# Patient Record
Sex: Male | Born: 1970 | Race: White | Hispanic: No | Marital: Single | Smoking: Former smoker
Health system: Southern US, Community
[De-identification: ages and names within clinical notes are randomized; demographics above are authoritative.]

## PROBLEM LIST (undated history)

## (undated) DIAGNOSIS — R002 Palpitations: Secondary | ICD-10-CM

## (undated) DIAGNOSIS — I10 Essential (primary) hypertension: Secondary | ICD-10-CM

## (undated) DIAGNOSIS — M1990 Unspecified osteoarthritis, unspecified site: Secondary | ICD-10-CM

## (undated) DIAGNOSIS — Z96649 Presence of unspecified artificial hip joint: Secondary | ICD-10-CM

## (undated) DIAGNOSIS — Z8619 Personal history of other infectious and parasitic diseases: Secondary | ICD-10-CM

## (undated) DIAGNOSIS — F1910 Other psychoactive substance abuse, uncomplicated: Secondary | ICD-10-CM

## (undated) DIAGNOSIS — T84050A Periprosthetic osteolysis of internal prosthetic right hip joint, initial encounter: Secondary | ICD-10-CM

## (undated) DIAGNOSIS — T8484XD Pain due to internal orthopedic prosthetic devices, implants and grafts, subsequent encounter: Secondary | ICD-10-CM

## (undated) HISTORY — DX: Personal history of other infectious and parasitic diseases: Z86.19

## (undated) HISTORY — PX: JOINT REPLACEMENT: SHX530

## (undated) HISTORY — DX: Other psychoactive substance abuse, uncomplicated: F19.10

---

## 2005-12-10 ENCOUNTER — Ambulatory Visit: Payer: Self-pay | Admitting: Family Medicine

## 2007-08-05 DIAGNOSIS — K219 Gastro-esophageal reflux disease without esophagitis: Secondary | ICD-10-CM

## 2007-08-05 DIAGNOSIS — F329 Major depressive disorder, single episode, unspecified: Secondary | ICD-10-CM

## 2010-11-12 ENCOUNTER — Emergency Department (HOSPITAL_COMMUNITY)
Admission: EM | Admit: 2010-11-12 | Discharge: 2010-11-12 | Payer: Self-pay | Source: Home / Self Care | Admitting: Emergency Medicine

## 2014-02-21 ENCOUNTER — Emergency Department: Payer: Self-pay | Admitting: Internal Medicine

## 2015-05-07 ENCOUNTER — Encounter
Admission: RE | Admit: 2015-05-07 | Discharge: 2015-05-07 | Disposition: A | Discharge status: Home / Self Care | Payer: Self-pay | Source: Ambulatory Visit | Attending: Orthopedic Surgery | Admitting: Orthopedic Surgery

## 2015-05-07 DIAGNOSIS — Z01812 Encounter for preprocedural laboratory examination: Secondary | ICD-10-CM | POA: Insufficient documentation

## 2015-05-07 HISTORY — DX: Essential (primary) hypertension: I10

## 2015-05-07 HISTORY — DX: Unspecified osteoarthritis, unspecified site: M19.90

## 2015-05-07 LAB — BASIC METABOLIC PANEL
Anion gap: 8 (ref 5–15)
BUN: 15 mg/dL (ref 6–20)
CO2: 30 mmol/L (ref 22–32)
CREATININE: 1.2 mg/dL (ref 0.61–1.24)
Calcium: 9.2 mg/dL (ref 8.9–10.3)
Chloride: 102 mmol/L (ref 101–111)
GFR calc Af Amer: >60 mL/min (ref >60)
GLUCOSE: 147 mg/dL — AB (ref 65–99)
Potassium: 3.7 mmol/L (ref 3.5–5.1)
SODIUM: 140 mmol/L (ref 135–145)

## 2015-05-07 LAB — URINALYSIS COMPLETE WITH MICROSCOPIC (ARMC ONLY)
BACTERIA UA: NONE SEEN
Bilirubin Urine: NEGATIVE
GLUCOSE, UA: NEGATIVE mg/dL
HGB URINE DIPSTICK: NEGATIVE
Ketones, ur: NEGATIVE mg/dL
LEUKOCYTES UA: NEGATIVE
NITRITE: NEGATIVE
Protein, ur: 30 mg/dL — AB
Specific Gravity, Urine: 1.015 (ref 1.005–1.030)
Squamous Epithelial / LPF: NONE SEEN
WBC UA: NONE SEEN WBC/hpf (ref 0–5)
pH: 6 (ref 5.0–8.0)

## 2015-05-07 LAB — CBC
HCT: 47.6 % (ref 40.0–52.0)
Hemoglobin: 16.6 g/dL (ref 13.0–18.0)
MCH: 31.9 pg (ref 26.0–34.0)
MCHC: 34.8 g/dL (ref 32.0–36.0)
MCV: 91.6 fL (ref 80.0–100.0)
PLATELETS: 145 10*3/uL — AB (ref 150–440)
RBC: 5.2 MIL/uL (ref 4.40–5.90)
RDW: 12.9 % (ref 11.5–14.5)
WBC: 5.9 10*3/uL (ref 3.8–10.6)

## 2015-05-07 LAB — APTT: APTT: 28 s (ref 24–36)

## 2015-05-07 LAB — SURGICAL PCR SCREEN
MRSA, PCR: NEGATIVE
STAPHYLOCOCCUS AUREUS: POSITIVE — AB

## 2015-05-07 LAB — SEDIMENTATION RATE: SED RATE: 1 mm/h (ref 0–15)

## 2015-05-07 LAB — PROTIME-INR
INR: 0.99
PROTHROMBIN TIME: 13.3 s (ref 11.4–15.0)

## 2015-05-07 NOTE — Patient Instructions (Signed)
  Your procedure is scheduled on: 05/14/15 Report to Day Surgery. To find out your arrival time please call [phone] between 1PM - 3PM on  05/13/15.  Remember: Instructions that are not followed completely may result in serious medical risk, up to and including death, or upon the discretion of your surgeon and anesthesiologist your surgery may need to be rescheduled.    __X__ 1. Do not eat food or drink liquids after midnight. No gum chewing or hard candies.     __X__ 2. No Alcohol for 24 hours before or after surgery.   ____ 3. Bring all medications with you on the day of surgery if instructed.    ___X_ 4. Notify your doctor if there is any change in your medical condition     (cold, fever, infections).     Do not wear jewelry, make-up, hairpins, clips or nail polish.  Do not wear lotions, powders, or perfumes. You may wear deodorant.  Do not shave 48 hours prior to surgery. Men may shave face and neck.  Do not bring valuables to the hospital.    [facility] is not [facility] any belongings or valuables.               Contacts, dentures or bridgework may not be worn into surgery.  Leave your suitcase in the car. After surgery it may be brought to your room.  For patients admitted to the hospital, discharge time is determined by your                treatment team.   Patients discharged the day of surgery will not be allowed to drive home.   Please read over the following fact sheets that you were given:   Surgical Site Infection Prevention   ____ Take these medicines the morning of surgery with A SIP OF WATER:    1  2.   3.   4.  5.  6.  ____ Fleet Enema (as directed)   _X__ Use CHG Soap as directed  ____ Use inhalers on the day of surgery  ____ Stop metformin 2 days prior to surgery    ____ Take 1/2 of usual insulin dose the night before surgery and none on the morning of surgery.   ____ Stop Coumadin/Plavix/aspirin on   __X__ Stop Anti-inflammatories on   STOP 05/07/15   ____ Stop supplements until after surgery.    ____ Bring C-Pap to the hospital.

## 2015-05-08 LAB — ABO/RH: ABO/RH(D): B NEG

## 2015-05-14 ENCOUNTER — Inpatient Hospital Stay
Admission: RE | Admit: 2015-05-14 | Discharge: 2015-05-16 | DRG: 470 | Disposition: A | Discharge status: Home Health | Payer: Self-pay | Source: Ambulatory Visit | Attending: Orthopedic Surgery | Admitting: Orthopedic Surgery

## 2015-05-14 ENCOUNTER — Inpatient Hospital Stay: Payer: Self-pay | Admitting: Anesthesiology

## 2015-05-14 ENCOUNTER — Encounter: Payer: Self-pay | Admitting: Anesthesiology

## 2015-05-14 ENCOUNTER — Inpatient Hospital Stay: Payer: Self-pay

## 2015-05-14 ENCOUNTER — Encounter
Admission: RE | Disposition: A | Discharge status: Home Health | Payer: Self-pay | Source: Ambulatory Visit | Attending: Orthopedic Surgery

## 2015-05-14 DIAGNOSIS — M1612 Unilateral primary osteoarthritis, left hip: Principal | ICD-10-CM | POA: Diagnosis present

## 2015-05-14 DIAGNOSIS — Z79891 Long term (current) use of opiate analgesic: Secondary | ICD-10-CM

## 2015-05-14 DIAGNOSIS — Z419 Encounter for procedure for purposes other than remedying health state, unspecified: Secondary | ICD-10-CM

## 2015-05-14 DIAGNOSIS — Z79899 Other long term (current) drug therapy: Secondary | ICD-10-CM

## 2015-05-14 DIAGNOSIS — I10 Essential (primary) hypertension: Secondary | ICD-10-CM | POA: Diagnosis present

## 2015-05-14 DIAGNOSIS — Z791 Long term (current) use of non-steroidal anti-inflammatories (NSAID): Secondary | ICD-10-CM

## 2015-05-14 DIAGNOSIS — G8918 Other acute postprocedural pain: Secondary | ICD-10-CM

## 2015-05-14 DIAGNOSIS — K219 Gastro-esophageal reflux disease without esophagitis: Secondary | ICD-10-CM | POA: Diagnosis present

## 2015-05-14 HISTORY — PX: TOTAL HIP ARTHROPLASTY: SHX124

## 2015-05-14 LAB — CBC
HEMATOCRIT: 44.2 % (ref 40.0–52.0)
Hemoglobin: 15.4 g/dL (ref 13.0–18.0)
MCH: 31.9 pg (ref 26.0–34.0)
MCHC: 34.9 g/dL (ref 32.0–36.0)
MCV: 91.5 fL (ref 80.0–100.0)
PLATELETS: 139 10*3/uL — AB (ref 150–440)
RBC: 4.83 MIL/uL (ref 4.40–5.90)
RDW: 12.8 % (ref 11.5–14.5)
WBC: 6.1 10*3/uL (ref 3.8–10.6)

## 2015-05-14 LAB — CREATININE, SERUM
Creatinine, Ser: 1.53 mg/dL — ABNORMAL HIGH (ref 0.61–1.24)
GFR calc Af Amer: >60 mL/min (ref >60)
GFR calc non Af Amer: 53 mL/min — ABNORMAL LOW (ref >60)

## 2015-05-14 SURGERY — ARTHROPLASTY, HIP, TOTAL, ANTERIOR APPROACH
Anesthesia: Spinal | Laterality: Left | Wound class: Clean

## 2015-05-14 MED ORDER — ACETAMINOPHEN 325 MG PO TABS
650.0000 mg | ORAL_TABLET | Freq: Four times a day (QID) | ORAL | Status: DC | PRN
  Administered 2015-05-15: 650 mg via ORAL
  Filled 2015-05-14: qty 2

## 2015-05-14 MED ORDER — DOCUSATE SODIUM 100 MG PO CAPS
100.0000 mg | ORAL_CAPSULE | Freq: Two times a day (BID) | ORAL | Status: DC
  Administered 2015-05-14 – 2015-05-16 (×4): 100 mg via ORAL
  Filled 2015-05-14 (×4): qty 1

## 2015-05-14 MED ORDER — PHENYLEPHRINE HCL 10 MG/ML IJ SOLN
INTRAMUSCULAR | Status: DC | PRN
  Administered 2015-05-14: 100 ug via INTRAVENOUS
  Administered 2015-05-14 (×2): 150 ug via INTRAVENOUS

## 2015-05-14 MED ORDER — BUPIVACAINE-EPINEPHRINE 0.25% -1:200000 IJ SOLN
INTRAMUSCULAR | Status: DC | PRN
  Administered 2015-05-14: 30 mL

## 2015-05-14 MED ORDER — ONDANSETRON HCL 4 MG/2ML IJ SOLN: 4.0000 mg | Freq: Four times a day (QID) | INTRAMUSCULAR | Status: DC | PRN

## 2015-05-14 MED ORDER — MAGNESIUM CITRATE PO SOLN: 1.0000 | Freq: Once | ORAL | Status: AC | PRN

## 2015-05-14 MED ORDER — ADULT MULTIVITAMIN W/MINERALS CH
1.0000 | ORAL_TABLET | Freq: Every day | ORAL | Status: DC
  Administered 2015-05-14 – 2015-05-15 (×2): 1 via ORAL
  Filled 2015-05-14 (×4): qty 1

## 2015-05-14 MED ORDER — METOCLOPRAMIDE HCL 5 MG/ML IJ SOLN: 5.0000 mg | Freq: Three times a day (TID) | INTRAMUSCULAR | Status: DC | PRN

## 2015-05-14 MED ORDER — VANCOMYCIN HCL 1000 MG IV SOLR
1000.0000 mg | Freq: Once | INTRAVENOUS | Status: DC
  Administered 2015-05-14: 1000 mg via INTRAVENOUS

## 2015-05-14 MED ORDER — FAMOTIDINE 20 MG PO TABS
20.0000 mg | ORAL_TABLET | Freq: Once | ORAL | Status: AC
  Administered 2015-05-14: 20 mg via ORAL

## 2015-05-14 MED ORDER — LIDOCAINE HCL (CARDIAC) 20 MG/ML IV SOLN
INTRAVENOUS | Status: DC | PRN
  Administered 2015-05-14: 30 mg via INTRAVENOUS

## 2015-05-14 MED ORDER — NICOTINE 21 MG/24HR TD PT24
21.0000 mg | MEDICATED_PATCH | Freq: Every day | TRANSDERMAL | Status: DC
  Administered 2015-05-14 – 2015-05-16 (×3): 21 mg via TRANSDERMAL
  Filled 2015-05-14 (×3): qty 1

## 2015-05-14 MED ORDER — LORAZEPAM 2 MG/ML IJ SOLN: 1.0000 mg | Freq: Four times a day (QID) | INTRAMUSCULAR | Status: DC | PRN

## 2015-05-14 MED ORDER — DIPHENHYDRAMINE HCL 50 MG/ML IJ SOLN
INTRAMUSCULAR | Status: DC | PRN
  Administered 2015-05-14: 25 mg via INTRAVENOUS

## 2015-05-14 MED ORDER — THIAMINE HCL 100 MG/ML IJ SOLN
100.0000 mg | Freq: Every day | INTRAMUSCULAR | Status: DC
  Filled 2015-05-14: qty 1
  Filled 2015-05-14: qty 2
  Filled 2015-05-14 (×2): qty 1

## 2015-05-14 MED ORDER — TRANEXAMIC ACID 1000 MG/10ML IV SOLN
1000.0000 mg | INTRAVENOUS | Status: AC
  Filled 2015-05-14 (×2): qty 10

## 2015-05-14 MED ORDER — MIDAZOLAM HCL 5 MG/5ML IJ SOLN
INTRAMUSCULAR | Status: DC | PRN
  Administered 2015-05-14: 1 mg via INTRAVENOUS

## 2015-05-14 MED ORDER — METOCLOPRAMIDE HCL 10 MG PO TABS: 5.0000 mg | ORAL_TABLET | Freq: Three times a day (TID) | ORAL | Status: DC | PRN

## 2015-05-14 MED ORDER — BISACODYL 10 MG RE SUPP: 10.0000 mg | Freq: Every day | RECTAL | Status: DC | PRN

## 2015-05-14 MED ORDER — LORAZEPAM 2 MG PO TABS: 0.0000 mg | ORAL_TABLET | Freq: Two times a day (BID) | ORAL | Status: DC

## 2015-05-14 MED ORDER — NEOMYCIN-POLYMYXIN B GU 40-200000 IR SOLN
Status: AC
  Filled 2015-05-14: qty 20

## 2015-05-14 MED ORDER — FOLIC ACID 1 MG PO TABS
1.0000 mg | ORAL_TABLET | Freq: Every day | ORAL | Status: DC
  Administered 2015-05-14 – 2015-05-16 (×3): 1 mg via ORAL
  Filled 2015-05-14 (×6): qty 1

## 2015-05-14 MED ORDER — CLINDAMYCIN PHOSPHATE 900 MG/50ML IV SOLN
INTRAVENOUS | Status: AC
  Administered 2015-05-14: 900 mg via INTRAVENOUS
  Filled 2015-05-14: qty 50

## 2015-05-14 MED ORDER — VANCOMYCIN HCL IN DEXTROSE 1-5 GM/200ML-% IV SOLN
1000.0000 mg | Freq: Once | INTRAVENOUS | Status: AC
  Administered 2015-05-14: 1000 mg via INTRAVENOUS
  Filled 2015-05-14: qty 200

## 2015-05-14 MED ORDER — ONDANSETRON HCL 4 MG PO TABS: 4.0000 mg | ORAL_TABLET | Freq: Four times a day (QID) | ORAL | Status: DC | PRN

## 2015-05-14 MED ORDER — LORAZEPAM 2 MG PO TABS
0.0000 mg | ORAL_TABLET | Freq: Four times a day (QID) | ORAL | Status: DC
  Administered 2015-05-14: 4 mg via ORAL
  Administered 2015-05-14 – 2015-05-15 (×2): 2 mg via ORAL
  Administered 2015-05-15: 4 mg via ORAL
  Administered 2015-05-15 (×2): 2 mg via ORAL
  Filled 2015-05-14 (×4): qty 2
  Filled 2015-05-14 (×2): qty 1
  Filled 2015-05-14: qty 2

## 2015-05-14 MED ORDER — CLINDAMYCIN PHOSPHATE 900 MG/50ML IV SOLN: 900.0000 mg | Freq: Once | INTRAVENOUS | Status: DC

## 2015-05-14 MED ORDER — MAGNESIUM HYDROXIDE 400 MG/5ML PO SUSP
30.0000 mL | Freq: Every day | ORAL | Status: DC | PRN
  Administered 2015-05-15 – 2015-05-16 (×2): 30 mL via ORAL
  Filled 2015-05-14 (×2): qty 30

## 2015-05-14 MED ORDER — VITAMIN B-1 100 MG PO TABS
100.0000 mg | ORAL_TABLET | Freq: Every day | ORAL | Status: DC
  Administered 2015-05-14 – 2015-05-15 (×2): 100 mg via ORAL
  Filled 2015-05-14 (×6): qty 1

## 2015-05-14 MED ORDER — FENTANYL CITRATE (PF) 100 MCG/2ML IJ SOLN: 25.0000 ug | INTRAMUSCULAR | Status: DC | PRN

## 2015-05-14 MED ORDER — NEOMYCIN-POLYMYXIN B GU 40-200000 IR SOLN
Status: DC | PRN
  Administered 2015-05-14: 4 mL

## 2015-05-14 MED ORDER — HYDROCODONE-ACETAMINOPHEN 5-325 MG PO TABS
1.0000 | ORAL_TABLET | Freq: Four times a day (QID) | ORAL | Status: DC | PRN
  Administered 2015-05-14: 1 via ORAL
  Administered 2015-05-15 – 2015-05-16 (×5): 2 via ORAL
  Filled 2015-05-14: qty 2
  Filled 2015-05-14: qty 1
  Filled 2015-05-14 (×4): qty 2

## 2015-05-14 MED ORDER — ZOLPIDEM TARTRATE 5 MG PO TABS
5.0000 mg | ORAL_TABLET | Freq: Every evening | ORAL | Status: DC | PRN
  Administered 2015-05-14: 5 mg via ORAL
  Filled 2015-05-14: qty 1

## 2015-05-14 MED ORDER — ONDANSETRON HCL 4 MG/2ML IJ SOLN: 4.0000 mg | Freq: Once | INTRAMUSCULAR | Status: DC | PRN

## 2015-05-14 MED ORDER — PROPOFOL 10 MG/ML IV BOLUS
INTRAVENOUS | Status: DC | PRN
  Administered 2015-05-14: 25 mg via INTRAVENOUS

## 2015-05-14 MED ORDER — ACETAMINOPHEN 650 MG RE SUPP: 650.0000 mg | Freq: Four times a day (QID) | RECTAL | Status: DC | PRN

## 2015-05-14 MED ORDER — FAMOTIDINE 20 MG PO TABS
ORAL_TABLET | ORAL | Status: AC
  Administered 2015-05-14: 20 mg via ORAL
  Filled 2015-05-14: qty 1

## 2015-05-14 MED ORDER — FENTANYL CITRATE (PF) 100 MCG/2ML IJ SOLN
INTRAMUSCULAR | Status: DC | PRN
  Administered 2015-05-14 (×2): 50 ug via INTRAVENOUS

## 2015-05-14 MED ORDER — PHENOL 1.4 % MT LIQD: 1.0000 | OROMUCOSAL | Status: DC | PRN

## 2015-05-14 MED ORDER — MENTHOL 3 MG MT LOZG: 1.0000 | LOZENGE | OROMUCOSAL | Status: DC | PRN

## 2015-05-14 MED ORDER — VANCOMYCIN HCL IN DEXTROSE 1-5 GM/200ML-% IV SOLN
1000.0000 mg | Freq: Two times a day (BID) | INTRAVENOUS | Status: AC
  Administered 2015-05-15: 1000 mg via INTRAVENOUS
  Filled 2015-05-14: qty 200

## 2015-05-14 MED ORDER — ENOXAPARIN SODIUM 40 MG/0.4ML ~~LOC~~ SOLN
40.0000 mg | SUBCUTANEOUS | Status: DC
  Administered 2015-05-15 – 2015-05-16 (×2): 40 mg via SUBCUTANEOUS
  Filled 2015-05-14 (×2): qty 0.4

## 2015-05-14 MED ORDER — MORPHINE SULFATE 2 MG/ML IJ SOLN
2.0000 mg | INTRAMUSCULAR | Status: DC | PRN
  Administered 2015-05-14 – 2015-05-15 (×13): 2 mg via INTRAVENOUS
  Filled 2015-05-14 (×14): qty 1

## 2015-05-14 MED ORDER — PROPOFOL INFUSION 10 MG/ML OPTIME
INTRAVENOUS | Status: DC | PRN
  Administered 2015-05-14: 100 ug/kg/min via INTRAVENOUS

## 2015-05-14 MED ORDER — LORAZEPAM 1 MG PO TABS
1.0000 mg | ORAL_TABLET | Freq: Four times a day (QID) | ORAL | Status: DC | PRN
  Administered 2015-05-15: 1 mg via ORAL
  Filled 2015-05-14: qty 1

## 2015-05-14 MED ORDER — BUPIVACAINE-EPINEPHRINE (PF) 0.25% -1:200000 IJ SOLN
INTRAMUSCULAR | Status: AC
  Filled 2015-05-14: qty 30

## 2015-05-14 MED ORDER — LACTATED RINGERS IV SOLN
INTRAVENOUS | Status: DC
  Administered 2015-05-14 (×2): via INTRAVENOUS

## 2015-05-14 SURGICAL SUPPLY — 43 items
BLADE SAW 1/2 (BLADE) ×3 IMPLANT
BNDG COHESIVE 6X5 TAN STRL LF (GAUZE/BANDAGES/DRESSINGS) ×6 IMPLANT
CANISTER SUCT 1200ML W/VALVE (MISCELLANEOUS) ×3 IMPLANT
CAPT HIP TOTAL 3 ×3 IMPLANT
CATH FOL LEG HOLDER (MISCELLANEOUS) ×3 IMPLANT
CATH TRAY 16F METER LATEX (MISCELLANEOUS) ×3 IMPLANT
CHLORAPREP W/TINT 26ML (MISCELLANEOUS) ×3 IMPLANT
DRAPE C-ARM XRAY 36X54 (DRAPES) ×3 IMPLANT
DRAPE INCISE IOBAN 66X60 STRL (DRAPES) IMPLANT
DRAPE POUCH INSTRU U-SHP 10X18 (DRAPES) ×3 IMPLANT
DRAPE SHEET LG 3/4 BI-LAMINATE (DRAPES) ×9 IMPLANT
DRAPE TABLE BACK 80X90 (DRAPES) ×3 IMPLANT
ELECT BLADE 6.5 EXT (BLADE) ×3 IMPLANT
GAUZE SPONGE 4X4 12PLY STRL (GAUZE/BANDAGES/DRESSINGS) ×3 IMPLANT
GLOVE BIOGEL PI IND STRL 9 (GLOVE) ×2 IMPLANT
GLOVE BIOGEL PI INDICATOR 9 (GLOVE) ×4
GLOVE SURG ORTHO 9.0 STRL STRW (GLOVE) ×6 IMPLANT
GOWN SPECIALTY ULTRA XL (MISCELLANEOUS) ×3 IMPLANT
GOWN STRL REUS W/ TWL LRG LVL3 (GOWN DISPOSABLE) ×1 IMPLANT
GOWN STRL REUS W/TWL LRG LVL3 (GOWN DISPOSABLE) ×2
HEMOVAC 400CC 10FR (MISCELLANEOUS) IMPLANT
HOOD PEEL AWAY FACE SHEILD DIS (HOOD) ×3 IMPLANT
MAT BLUE FLOOR 46X72 FLO (MISCELLANEOUS) ×3 IMPLANT
NDL SAFETY 18GX1.5 (NEEDLE) ×3 IMPLANT
NEEDLE SPNL 18GX3.5 QUINCKE PK (NEEDLE) ×3 IMPLANT
NS IRRIG 1000ML POUR BTL (IV SOLUTION) ×3 IMPLANT
PACK HIP COMPR (MISCELLANEOUS) ×3 IMPLANT
SOL PREP PVP 2OZ (MISCELLANEOUS) ×3
SOLUTION PREP PVP 2OZ (MISCELLANEOUS) ×1 IMPLANT
STAPLER SKIN PROX 35W (STAPLE) ×3 IMPLANT
STRAP SAFETY BODY (MISCELLANEOUS) ×3 IMPLANT
SUT DVC 2 QUILL PDO  T11 36X36 (SUTURE) ×4
SUT DVC 2 QUILL PDO T11 36X36 (SUTURE) ×2 IMPLANT
SUT DVC QUILL MONODERM 30X30 (SUTURE) ×3 IMPLANT
SUT ETHIBOND NAB CT1 #1 30IN (SUTURE) ×3 IMPLANT
SUT SILK 0 (SUTURE) ×2
SUT SILK 0 30XBRD TIE 6 (SUTURE) ×1 IMPLANT
SUT VIC AB 1 CT1 36 (SUTURE) ×3 IMPLANT
SYR 20CC LL (SYRINGE) ×3 IMPLANT
SYR 30ML LL (SYRINGE) ×3 IMPLANT
TAPE MICROFOAM 4IN (TAPE) ×3 IMPLANT
TUBE KAMVAC SUCTION (TUBING) IMPLANT
WATER STERILE IRR 1000ML POUR (IV SOLUTION) ×3 IMPLANT

## 2015-05-14 NOTE — Op Note (Signed)
05/14/2015  2:36 PM  PATIENT:  Angel Bautista  44 y.o. male  PRE-OPERATIVE DIAGNOSIS:  LEFT HIP OSTEOARTHRITIS  POST-OPERATIVE DIAGNOSIS:  LEFT HIP OSTEOARTHRITIS  PROCEDURE:  Procedure(s): TOTAL HIP ARTHROPLASTY ANTERIOR APPROACH (Left)  SURGEON: Leitha SchullerMichael J Aseem Sessums, MD  ASSISTANTS: None  ANESTHESIA:   spinal  EBL:  Total I/O In: 1600 [I.V.:1600] Out: 250 [Blood:250]  BLOOD ADMINISTERED:none  DRAINS: none   LOCAL MEDICATIONS USED:  BUPIVICAINE    SPECIMEN:  Source of Specimen:  Femoral head  DISPOSITION OF SPECIMEN:  PATHOLOGY  COUNTS:  YES  TOURNIQUET:  * No tourniquets in log *  IMPLANTS: Versed fit cup DM 56 mm with liner M 28 mm head and a 3 lateralized AMIS stem  DICTATION: .Dragon Dictation  The patient was brought to the operating room and after spinal anesthesia was obtained the left leg was placed on the operative table with the ipsilateral foot into the Medacta attachment, contralateral leg on a well-padded table. C-arm was brought in and preop template x-ray taken. After prepping and draping in usual sterile fashion appropriate patient identification and timeout procedures were completed. Anterior approach to the hip was obtained and centered over the greater trochanter and TFL muscle. The subcutaneous tissue was incised hemostasis being achieved by electrocautery. TFL fascia was incised and the muscle retracted laterally deep retractor placed. The lateral femoral circumflex vessels were identified and ligated. The anterior capsule was exposed and a capsulotomy performed. The neck was identified and a femoral neck cut carried out with a saw. The head was removed without difficulty and showed sclerotic femoral head and acetabulum. Reaming was carried out to 56 mm and a 56 mm cup trial gave appropriate tightness to the acetabular component a 56 Versed fit cup  was impacted into position. The leg was then externally rotated and ischiofemoral and patellofemoral releases  carried out. The femur was sequentially broached to a size 3, size size 3 lateralized stem with M head trials were placed and the final components chosen. The 3 lateral stem was inserted along with a M 28 mm head and 56 mm liner. The hip was reduced and was stable the wound was thoroughly irrigated with a dilute Betadine solution. The deep fascia view. Using a heavy Quill after infiltration of 30 cc of quarter percent Sensorcaine with epinephrine. Subcutaneous drains were then inserted to a Quill to close the skin with skin staples Xeroform 4 x 4's ABDs and tape patient was sent to recovery in stable condition complications none specimen removed femoral head issue comes stable.  PLAN OF CARE: Admit to inpatient

## 2015-05-14 NOTE — Anesthesia Preprocedure Evaluation (Addendum)
Anesthesia Evaluation  Patient identified by MRN, date of birth, ID band Patient awake    Reviewed: Allergy & Precautions, NPO status , Patient's Chart, lab work & pertinent test results  Airway Mallampati: II  TM Distance: >3 FB Neck ROM: Full    Dental  (+) Chipped   Pulmonary Current Smoker,  breath sounds clear to auscultation  Pulmonary exam normal       Cardiovascular hypertension, Normal cardiovascular exam    Neuro/Psych PSYCHIATRIC DISORDERS Depression negative neurological ROS     GI/Hepatic Neg liver ROS, GERD-  Medicated and Controlled,  Endo/Other  negative endocrine ROS  Renal/GU negative Renal ROS     Musculoskeletal  (+) Arthritis -, Osteoarthritis,    Abdominal Normal abdominal exam  (+)   Peds negative pediatric ROS (+)  Hematology negative hematology ROS (+)   Anesthesia Other Findings   Reproductive/Obstetrics                            Anesthesia Physical Anesthesia Plan  ASA: II  Anesthesia Plan: Spinal   Post-op Pain Management:    Induction: Intravenous  Airway Management Planned: Nasal Cannula  Additional Equipment:   Intra-op Plan:   Post-operative Plan:   Informed Consent: I have reviewed the patients History and Physical, chart, labs and discussed the procedure including the risks, benefits and alternatives for the proposed anesthesia with the patient or authorized representative who has indicated his/her understanding and acceptance.   Dental advisory given  Plan Discussed with: CRNA and Surgeon  Anesthesia Plan Comments:         Anesthesia Quick Evaluation

## 2015-05-14 NOTE — H&P (Signed)
Reviewed paper H+P, will be scanned into chart. No changes noted.  

## 2015-05-14 NOTE — Transfer of Care (Signed)
Immediate Anesthesia Transfer of Care Note  Patient: Angel Bautista  Procedure(s) Performed: Procedure(s): TOTAL HIP ARTHROPLASTY ANTERIOR APPROACH (Left)  Patient Location: PACU  Anesthesia Type:Spinal  Level of Consciousness: awake, alert  and oriented  Airway & Oxygen Therapy: Patient Spontanous Breathing and Patient connected to nasal cannula oxygen  Post-op Assessment: Report given to RN and Post -op Vital signs reviewed and stable  Post vital signs: Reviewed and stable  Last Vitals:  Filed Vitals:   05/14/15 1443  BP:   Pulse:   Temp: 36.8 C  Resp:     Complications: No apparent anesthesia complications

## 2015-05-14 NOTE — Anesthesia Procedure Notes (Signed)
Spinal Patient location during procedure: OR Start time: 05/14/2015 12:45 PM End time: 05/14/2015 12:50 PM Staffing Anesthesiologist: Yves DillARROLL, Eleina Jergens Performed by: anesthesiologist  Preanesthetic Checklist Completed: patient identified, site marked, surgical consent, pre-op evaluation, timeout performed, IV checked, risks and benefits discussed and monitors and equipment checked Spinal Block Patient position: sitting Prep: Betadine and site prepped and draped Patient monitoring: heart rate, cardiac monitor, continuous pulse ox and blood pressure Approach: midline Location: L3-4 Injection technique: single-shot Needle Needle type: Whitacre  Needle gauge: 25 G Needle length: 9 cm Assessment Sensory level: T8 Additional Notes Time out called.  Patient placed in sitting position.  Prepped and draped in sterile fashion.  A skin wheal was made with 1% Lidocaine plain.  A 25 G WH needle was advanced with the return of clear, colorless CSF in all 4 Quadrants.  15mg  of spinal marcaine with 10mg  of spinal tetracaine and epi 1: 200K wash injected after positive aspiration of CSF. No paresthesias.  Patient tolerated the procedure well.

## 2015-05-15 LAB — CBC
HEMATOCRIT: 43.6 % (ref 40.0–52.0)
Hemoglobin: 15 g/dL (ref 13.0–18.0)
MCH: 31.4 pg (ref 26.0–34.0)
MCHC: 34.3 g/dL (ref 32.0–36.0)
MCV: 91.6 fL (ref 80.0–100.0)
PLATELETS: 121 10*3/uL — AB (ref 150–440)
RBC: 4.76 MIL/uL (ref 4.40–5.90)
RDW: 12.6 % (ref 11.5–14.5)
WBC: 6.4 10*3/uL (ref 3.8–10.6)

## 2015-05-15 LAB — BASIC METABOLIC PANEL
Anion gap: 9 (ref 5–15)
BUN: 14 mg/dL (ref 6–20)
CALCIUM: 8.7 mg/dL — AB (ref 8.9–10.3)
CO2: 29 mmol/L (ref 22–32)
Chloride: 99 mmol/L — ABNORMAL LOW (ref 101–111)
Creatinine, Ser: 1.18 mg/dL (ref 0.61–1.24)
GFR calc Af Amer: >60 mL/min (ref >60)
GFR calc non Af Amer: >60 mL/min (ref >60)
GLUCOSE: 113 mg/dL — AB (ref 65–99)
Potassium: 3.8 mmol/L (ref 3.5–5.1)
Sodium: 137 mmol/L (ref 135–145)

## 2015-05-15 LAB — TYPE AND SCREEN
ABO/RH(D): B NEG
ANTIBODY SCREEN: NEGATIVE

## 2015-05-15 MED ORDER — CLONIDINE HCL 0.1 MG PO TABS
0.2000 mg | ORAL_TABLET | Freq: Two times a day (BID) | ORAL | Status: DC
  Administered 2015-05-15 – 2015-05-16 (×3): 0.2 mg via ORAL
  Filled 2015-05-15 (×3): qty 2

## 2015-05-15 MED ORDER — CELECOXIB 200 MG PO CAPS
200.0000 mg | ORAL_CAPSULE | Freq: Two times a day (BID) | ORAL | Status: DC
  Administered 2015-05-15 – 2015-05-16 (×2): 200 mg via ORAL
  Filled 2015-05-15 (×2): qty 1

## 2015-05-15 NOTE — Anesthesia Post-op Follow-up Note (Signed)
  Anesthesia Pain Follow-up Note  Patient: Angel Bautista  Day #: 1  Date of Follow-up: 05/15/2015 Time: 7:48 AM  Last Vitals:  Filed Vitals:   05/15/15 0430  BP: 176/97  Pulse: 100  Temp: 36.7 C  Resp: 18    Level of Consciousness: alert  Pain: mild   Side Effects:None  Catheter Site Exam:clean, dry, no drainage  Plan: D/C from anesthesia care  Chong SicilianLopez,  Keyera Hattabaugh

## 2015-05-15 NOTE — Progress Notes (Signed)
Physical Therapy Treatment Patient Details Name: Angel Bautista MRN: [id number] DOB: 20-Aug-1971 Today's Date: 05/15/2015    History of Present Illness This patient is a 44 year old male [facility] (anterior approach)    PT Comments    Pt demonstrates good progress with therapist. He remains very lethargic throughout session but gradually improves. Pt continues to demonstrate poor weight shift to LLE with ambulation. Pain improves with mobility. Pt educated about seated and bed exercises. He is now agreeing to rolling walker and HH PT at discharge. Care manager notified. Pt will benefit from skilled PT services to address deficits in strength, balance, and mobility in order to return to full function at home.    Follow Up Recommendations  Home health PT     Equipment Recommendations  Rolling walker with 5" wheels    Recommendations for Other Services       Precautions / Restrictions Precautions Precautions: Anterior Hip Precaution Booklet Issued: Yes (comment) Restrictions Weight Bearing Restrictions: No LLE Weight Bearing: Weight bearing as tolerated    Mobility  Bed Mobility Overal bed mobility: Needs Assistance Bed Mobility: Supine to Sit;Sit to Supine     Supine to sit: Supervision;HOB elevated Sit to supine: Supervision;HOB elevated   General bed mobility comments: pt using UE's to move L LE. Somewhat impulsive but reasonable strength noted  Transfers Overall transfer level: Needs assistance Equipment used: Rolling walker (2 wheeled) Transfers: Sit to/from Stand Sit to Stand: Min guard (CGA standing; min assist to control descent sitting in chair)         General transfer comment: vc's required for hand and feet placement and correct use of RW  Ambulation/Gait Ambulation/Gait assistance: Min guard Ambulation Distance (Feet): 80 Feet Assistive device: Rolling walker (2 wheeled) Gait Pattern/deviations: Decreased step length -  right;Decreased weight shift to left;Step-to pattern;Antalgic   Gait velocity interpretation: <1.8 ft/sec, indicative of risk for recurrent falls General Gait Details: Decreased stance time and WB on LLE. Pt requires verbal cues to increase RLE step length and decrease UE reliance. Cues to push off with LLE at terminal stance. Pain and fatigue monitored throughout ambulation distance. Gait slowly improves throughout distance   Stairs            Wheelchair Mobility    Modified Rankin (Stroke Patients Only)       Balance     Sitting balance-Leahy Scale: Normal       Standing balance-Leahy Scale: Good                      Cognition Arousal/Alertness: Lethargic;Suspect due to medications Behavior During Therapy: [facility] for tasks assessed/performed Overall Cognitive Status: Within Functional Limits for tasks assessed       Memory: Decreased recall of precautions              Exercises Total Joint Exercises Ankle Circles/Pumps: Strengthening;Both;15 reps;Seated Hip ABduction/ADduction: Strengthening;15 reps;Both;Seated (Towel assist in supine x 10 LLE) Straight Leg Raises: Strengthening;Left;10 reps;Supine (Towel assist) Long Arc Quad: Strengthening;Both;15 reps;Seated Knee Flexion: Strengthening;Both;15 reps;Seated Marching in Standing: Strengthening;Both;15 reps;Seated    General Comments        Pertinent Vitals/Pain Pain Assessment: 0-10 Pain Score: 6  (Decreases to 4/10 with mobility) Pain Location: L hip Pain Intervention(s): Monitored during session;Patient requesting pain meds-RN notified    Home Living                      Prior  Function            PT Goals (current goals can now be found in the care plan section) Acute Rehab PT Goals Patient Stated Goal: To go home PT Goal Formulation: With patient Time For Goal Achievement: 05/29/15 Potential to Achieve Goals: Good Progress towards PT goals: Progressing toward goals     Frequency  BID    PT Plan Current plan remains appropriate    Co-evaluation             End of Session Equipment Utilized During Treatment: Gait belt Activity Tolerance: Patient limited by pain Patient left: with call bell/phone within reach;with family/visitor present;with SCD's reapplied;in bed;with bed alarm set (B heels elevated via pillow)     Time: 4098-1191 PT Time Calculation (min) (ACUTE ONLY): 30 min  Charges:  $Gait Training: 8-22 mins $Therapeutic Exercise: 8-22 mins                    G Codes:      Angel Bautista PT, DPT   Joseth Weigel 05/15/2015, 3:38 PM

## 2015-05-15 NOTE — Progress Notes (Signed)
   Subjective: 1 Day Post-Op Procedure(s) (LRB): TOTAL HIP ARTHROPLASTY ANTERIOR APPROACH (Left) Patient reports pain as 5 on 0-10 scale.   Patient is having problems with pain in the left thigh, requiring pain medications.  We will start therapy today.  Plan is to go Home after hospital stay.  Objective: Vital signs in last 24 hours: Temp:  [97.3 F (36.3 C)-98.9 F (37.2 C)] 98 F (36.7 C) (07/13 0430) Pulse Rate:  [36-101] 100 (07/13 0430) Resp:  [11-20] 18 (07/13 0430) BP: (102-191)/(59-112) 176/97 mmHg (07/13 0430) SpO2:  [94 %-100 %] 97 % (07/13 0430) FiO2 (%):  [21 %] 21 % (07/12 1554) Weight:  [94.348 kg (208 lb)] 94.348 kg (208 lb) (07/12 1147)  Intake/Output from previous day: 07/12 0701 - 07/13 0700 In: 2920 [P.O.:720; I.V.:2000; IV Piggyback:200] Out: 2290 [Urine:2040; Blood:250] Intake/Output this shift:     Recent Labs  05/14/15 1602 05/15/15 0502  HGB 15.4 15.0    Recent Labs  05/14/15 1602 05/15/15 0502  WBC 6.1 6.4  RBC 4.83 4.76  HCT 44.2 43.6  PLT 139* 121*    Recent Labs  05/14/15 1602 05/15/15 0502  NA  --  137  K  --  3.8  CL  --  99*  CO2  --  29  BUN  --  14  CREATININE 1.53* 1.18  GLUCOSE  --  113*  CALCIUM  --  8.7*   No results for input(s): LABPT, INR in the last 72 hours.  EXAM General - Patient is Alert, Appropriate and Oriented Extremity - Neurovascular intact Sensation intact distally Intact pulses distally Dorsiflexion/Plantar flexion intact  - homans sign Dressing - dressing C/D/I Motor Function - intact, moving foot and toes well on exam.   Past Medical History  Diagnosis Date  . Hypertension   . Arthritis     Assessment/Plan:   1 Day Post-Op Procedure(s) (LRB): TOTAL HIP ARTHROPLASTY ANTERIOR APPROACH (Left) Active Problems:   Primary osteoarthritis of left hip  Estimated body mass index is 32.57 kg/(m^2) as calculated from the following:   Height as of this encounter: 5\' 7"  (1.702 m).   Weight  as of this encounter: 94.348 kg (208 lb). Advance diet Up with therapy  Continue with current pain regimen Needs BM before discharge  DVT Prophylaxis - Lovenox, Foot Pumps and TED hose Weight-Bearing as tolerated to left leg D/C O2 and Pulse OX and try on Room Air  T. Cranston Neighborhris Gaines, PA-C [facility] 05/15/2015, 7:39 AM

## 2015-05-15 NOTE — Evaluation (Signed)
Occupational Therapy Evaluation Patient Details Name: Angel Bautista MRN: [id number] DOB: 03-12-1971 Today's Date: 05/15/2015    History of Present Illness This patient is a 44 year old male who came to [facility] for a L THR (anterior approach)   Clinical Impression   This patient is a 44 year old male who came to [facility] for a L total hip replacement (anterior approach).  Patient lives in a  home with his wife.  He had been independent with ADL and functional mobility with occasional cane use and works.  He now requires minimal assistance for lower body dressing. He would benefit from Occupational Therapy for ADL/functional mobility training while .staying within hip precautioins (anterior approach)       Follow Up Recommendations       Equipment Recommendations       Recommendations for Other Services       Precautions / Restrictions Precautions Precautions: Anterior Hip Precaution Booklet Issued: Yes (comment) Restrictions Weight Bearing Restrictions: Yes LLE Weight Bearing: Weight bearing as tolerated      Mobility Bed Mobility          Transfers Overall transfer level: Needs assistance Equipment used: Rolling walker (2 wheeled) Transfers: Sit to/from Stand;Stand Pivot Transfers Stand pivot transfers: Min guard          Balance                                  ADL                                         General ADL Comments: Had been independent and working, now needs minimal assist for lower body dressing. Practiced donning and doffing socks and pants with minimal assist. used reacher and sock aid for sock, did not need adapted devices for pants.     Vision     Perception     Praxis      Pertinent Vitals/Pain Pain Assessment: 0-10 Pain Score: 8  Pain Location: L hip Pain Descriptors / Indicators:  (meds recieved) Pain Intervention(s): Limited activity within patient's tolerance;Monitored  during session;Premedicated before session;Repositioned;Patient requesting pain meds-RN notified     Hand Dominance     Extremity/Trunk Assessment Upper Extremity Assessment Upper Extremity Assessment:  (B UE WNL)         Communication Communication Communication: No difficulties   Cognition Arousal/Alertness: Awake/alert Behavior During Therapy: WFL for tasks assessed/performed Overall Cognitive Status: Within Functional Limits for tasks assessed       Memory: Decreased recall of precautions             General Comments       Exercises       Shoulder Instructions      Home Living Family/patient expects to be discharged to:: Private residence Living Arrangements: Spouse/significant other   Type of Home: House Home Access: Stairs to enter [employer] of Steps: 4 with B railing Entrance Stairs-Rails: Can reach both Home Layout: One level               Home Equipment: Cane - single point          Prior Functioning/Environment Level of Independence: Independent        Comments: works    OT Diagnosis: Acute pain   OT Problem List:  OT Treatment/Interventions: Self-care/ADL training    OT Goals(Current goals can be found in the care plan section) Acute Rehab OT Goals Patient Stated Goal: To go home OT Goal Formulation: With patient Time For Goal Achievement: 05/29/15 Potential to Achieve Goals: Good  OT Frequency: Min 1X/week   Barriers to D/C:            Co-evaluation              End of Session Equipment Utilized During Treatment:  (hip kit)  Activity Tolerance: Patient tolerated treatment well Patient left: in chair;with call bell/phone within reach;with chair alarm set;with family/visitor present   Time: 1001-1041 OT Time Calculation (min): 40 min Charges:  OT General Charges $OT Visit: 1 Procedure OT Evaluation $Initial OT Evaluation Tier I: 1 Procedure OT Treatments $Self Care/Home Management : 8-22  mins G-Codes:    Myrene Galas, MS/OTR/L  05/15/2015, 10:48 AM

## 2015-05-15 NOTE — Anesthesia Postprocedure Evaluation (Signed)
  Anesthesia Post-op Note  Patient: Angel Bautista  Procedure(s) Performed: Procedure(s): TOTAL HIP ARTHROPLASTY ANTERIOR APPROACH (Left)  Anesthesia type:Spinal  Patient location: 146A  Post pain: Pain level controlled  Post assessment: Post-op Vital signs reviewed, Patient's Cardiovascular Status Stable, Respiratory Function Stable, Patent Airway and No signs of Nausea or vomiting  Post vital signs: Reviewed and stable  Last Vitals:  Filed Vitals:   05/15/15 0430  BP: 176/97  Pulse: 100  Temp: 36.7 C  Resp: 18    Level of consciousness: awake, alert  and patient cooperative  Complications: No apparent anesthesia complications

## 2015-05-15 NOTE — Evaluation (Signed)
Physical Therapy Evaluation Patient Details Name: Angel Bautista MRN: [id number] DOB: [birth year] Today's Date: 05/15/2015   History of Present Illness  Pt is a 44 y.o. male s/p L THA anterior approach [facility]14/15 [facility].  Clinical Impression  Currently pt demonstrates impairments with L LE strength, balance, pain, and limitations with functional mobility.  Prior to admission, pt was independent and working (uses [facility] on "bad days").  Pt lives with his significant other.  Currently pt is SBA supine to sit; CGA to min assist with transfers with RW; and CGA with ambulation with RW.  Pt would benefit from skilled PT to address above noted impairments and functional limitations.  Recommend pt discharge to home with HHPT when medically appropriate.     Follow Up Recommendations Home health PT    Equipment Recommendations  Rolling walker with 5" wheels    Recommendations for Other Services       Precautions / Restrictions Precautions Precautions: Anterior Hip Precaution Booklet Issued: Yes (comment) Restrictions Weight Bearing Restrictions: Yes LLE Weight Bearing: Weight bearing as tolerated      Mobility  Bed Mobility Overal bed mobility: Needs Assistance Bed Mobility: Supine to Sit     Supine to sit: Supervision;HOB elevated     General bed mobility comments: pt using UE's to move L LE  Transfers Overall transfer level: Needs assistance Equipment used: Rolling walker (2 wheeled) Transfers: Sit to/from UGI CorporationStand;Stand Pivot Transfers Sit to Stand: Min guard;Min assist (CGA standing; min assist to control descent sitting in chair) Stand pivot transfers: Min guard       General transfer comment: vc's required for hand and feet placement and correct use of RW  Ambulation/Gait Ambulation/Gait assistance: Min guard Ambulation Distance (Feet): 40 Feet Assistive device: Rolling walker (2 wheeled)       General Gait Details: decreased stance time/WB'ing L LE; vc's required to  increase L LE step length and for walker use  Stairs            Wheelchair Mobility    Modified Rankin (Stroke Patients Only)       Balance Overall balance assessment: Needs assistance Sitting-balance support: No upper extremity supported;Feet supported Sitting balance-Leahy Scale: Normal     Standing balance support: Bilateral upper extremity supported Standing balance-Leahy Scale: Good                               Pertinent Vitals/Pain Pain Assessment: 0-10 Pain Score: 8  Pain Location: L hip Pain Descriptors / Indicators: Sharp;Shooting;Sore Pain Intervention(s): Limited activity within patient's tolerance;Monitored during session;Premedicated before session;Repositioned;Patient requesting pain meds-RN notified  During session, pt's HR 106-108 bpm and O2 >92% on room air.    Home Living Family/patient expects to be discharged to:: Private residence Living Arrangements: Spouse/significant other (Pt's significant other Corrie Dandy(Mary))   Type of Home: House Home Access: Stairs to enter Entrance Stairs-Rails: Right;Left;Can reach both Secretary/administratorntrance Stairs-Number of Steps: 4 with B railing Home Layout: One level Home Equipment: Cane - single point      Prior Function Level of Independence: Independent (working)               Higher education careers adviserHand Dominance        Extremity/Trunk Assessment   Upper Extremity Assessment: Overall WFL for tasks assessed           Lower Extremity Assessment: RLE deficits/detail;LLE deficits/detail RLE Deficits / Details: WFL LLE Deficits / Details: L hip flexion at least  3-/5; L knee flexion/extension at least 3/5; L DF at least 4/5     Communication   Communication: No difficulties  Cognition Arousal/Alertness: Lethargic (Pt initially drowsy but after waking up more, pt stayed awake and alert during session) Behavior During Therapy: WFL for tasks assessed/performed Overall Cognitive Status: Within Functional Limits for tasks  assessed       Memory: Decreased recall of precautions              General Comments  Nursing cleared pt for participation in PT.  Pt agreeable to PT session.  Pt's dad present end of session.    Exercises  Treatment:  Performed x10 B LE semi-supine therapeutic exercise:  Quad sets with 3 second holds (AROM R; AROM L); hip Adduction isometrics x3 seconds (pillow between thighs) (AROM B LE's); ankle pumps (AROM B LE's); gluteal sets x3 second holds (AROM B); heelslides (AROM R; AAROM L); SAQ's (AROM R; AROM L); hip abduction/adduction (AROM R; AAROM L); and SLR (AROM R; AAROM L).  Pt required vc's and tactile cues for correct technique with ex's.      Assessment/Plan    PT Assessment Patient needs continued PT services  PT Diagnosis Abnormality of gait;Acute pain   PT Problem List Decreased strength;Decreased activity tolerance;Decreased balance;Decreased mobility;Pain;Decreased knowledge of use of DME;Decreased knowledge of precautions  PT Treatment Interventions DME instruction;Gait training;Stair training;Functional mobility training;Therapeutic activities;Therapeutic exercise;Balance training;Patient/family education   PT Goals (Current goals can be found in the Care Plan section) Acute Rehab PT Goals Patient Stated Goal: To go home PT Goal Formulation: With patient Time For Goal Achievement: 05/29/15 Potential to Achieve Goals: Good    Frequency BID   Barriers to discharge        Co-evaluation               End of Session Equipment Utilized During Treatment: Gait belt Activity Tolerance: Patient limited by pain Patient left: in chair;with call bell/phone within reach;with chair alarm set;with family/visitor present;with SCD's reapplied (B heels elevated via pillow) Nurse Communication: Mobility status;Patient requests pain meds         Time: 0918-0950 PT Time Calculation (min) (ACUTE ONLY): 32 min   Charges:   PT Evaluation $Initial PT Evaluation Tier  I: 1 Procedure PT Treatments $Therapeutic Exercise: 8-22 mins   PT G CodesHendricks Limes 05/20/2015, 10:37 AM Hendricks Limes, PT [phone]

## 2015-05-15 NOTE — Progress Notes (Signed)
Pt continues to need IV morphine fairly regularly including hydrocodone every 6 hours. Educated patient on complications that come with narcotic use including P/O ileus. Explained the expectations of P/O day 2 as well as discharge day highlighting he will not be able to go home until he has a BM. He has been very lethargic most of the day states he is in pain then many times falls asleep. Pt is also on ativan for ETOH  Withdrawal. His BP has been high despite all of the narcotics spoke with Dr Rosita KeaMenz orders received for clonidine which has helped his hypertension. Family present during education. Pt still requesting IV morphine. Spoke with Dr Rosita KeaMenz new orders received.

## 2015-05-15 NOTE — Care Management Note (Signed)
Case Management Note  Patient Details  Name: Angel Bautista MRN: [id number] Date of Birth: Feb 12, 1971  Subjective/Objective:                   Met with patient to discuss discharge planning. Patient does not has health insurance. He does not want home health and is undecided about [facility] for physical therapy. Lovenox out of pocket ~$900. He has a cane and crutches and declined rolling walker. He lives with his girlfriend and states he will have her call this RNCM to discuss discharge planning. He uses [facility].    Action/Plan:  RNCM will continue to follow. Offered free PT through [facility] Phone: [phone] Fax: [phone] E-mail: [email or web address] Address: [address] List of home health agencies left with patient.   Expected Discharge Date:                  Expected Discharge Plan:     In-House Referral:     Discharge planning Services  CM Consult  Post Acute Care Choice:    Choice offered to:  Patient  DME Arranged:    DME Agency:     HH Arranged:    [place] Agency:     Status of Service:     Medicare Important Message Given:    Date Medicare IM Given:    Medicare IM give by:    Date Additional Medicare IM Given:    Additional Medicare Important Message give by:     If discussed at [place] of Stay Meetings, dates discussed:    Additional Comments:  Marshell Garfinkel, RN 05/15/2015, 10:56 AM

## 2015-05-15 NOTE — Outcomes Assessment (Signed)
Patient is A+O with poor pain management. Constantly stating pain is 10/10 and shortly relieved with prn pain medications.  CIWA per protocol, foley d/c'd

## 2015-05-16 LAB — BASIC METABOLIC PANEL
Anion gap: 8 (ref 5–15)
BUN: 16 mg/dL (ref 6–20)
CO2: 31 mmol/L (ref 22–32)
CREATININE: 1.23 mg/dL (ref 0.61–1.24)
Calcium: 8.8 mg/dL — ABNORMAL LOW (ref 8.9–10.3)
Chloride: 95 mmol/L — ABNORMAL LOW (ref 101–111)
GLUCOSE: 138 mg/dL — AB (ref 65–99)
Potassium: 4 mmol/L (ref 3.5–5.1)
SODIUM: 134 mmol/L — AB (ref 135–145)

## 2015-05-16 LAB — CBC
HCT: 41.4 % (ref 40.0–52.0)
Hemoglobin: 14.5 g/dL (ref 13.0–18.0)
MCH: 31.8 pg (ref 26.0–34.0)
MCHC: 35 g/dL (ref 32.0–36.0)
MCV: 91 fL (ref 80.0–100.0)
PLATELETS: 117 10*3/uL — AB (ref 150–440)
RBC: 4.55 MIL/uL (ref 4.40–5.90)
RDW: 12.6 % (ref 11.5–14.5)
WBC: 7.9 10*3/uL (ref 3.8–10.6)

## 2015-05-16 MED ORDER — ASPIRIN EC 325 MG PO TBEC: 325.0000 mg | DELAYED_RELEASE_TABLET | Freq: Every day | ORAL | Status: DC

## 2015-05-16 MED ORDER — HYDROCODONE-ACETAMINOPHEN 5-325 MG PO TABS
1.0000 | ORAL_TABLET | ORAL | Status: DC | PRN
  Administered 2015-05-16 (×2): 2 via ORAL
  Filled 2015-05-16 (×2): qty 2

## 2015-05-16 MED ORDER — ENOXAPARIN SODIUM 40 MG/0.4ML ~~LOC~~ SOLN: 40.0000 mg | Freq: Two times a day (BID) | SUBCUTANEOUS | Status: DC

## 2015-05-16 MED ORDER — HYDROCODONE-ACETAMINOPHEN 5-325 MG PO TABS: 1.0000 | ORAL_TABLET | ORAL | Status: DC | PRN

## 2015-05-16 MED ORDER — CELECOXIB 200 MG PO CAPS: 200.0000 mg | ORAL_CAPSULE | Freq: Two times a day (BID) | ORAL | Status: DC

## 2015-05-16 NOTE — Progress Notes (Signed)
Physical Therapy Treatment Patient Details Name: Angel Bautista MRN: [id number] DOB: Jan 16, 1971 Today's Date: 05/16/2015    History of Present Illness This patient is a 44 year old male who came to [facility] for a L THR (anterior approach)    PT Comments    Pt is making excellent progress with therapy but is impulsive and seems somewhat reckless. He asks therapist questions about weight lifting and driving which were deferred to orthopedist. Advised pt not to return to gym or drive until cleared by orthopedist. He was reminded continually about anterior hip precautions and risk of dislocation. Encouraged pt to follow up with OP PT. Pt has met all barriers to discharge and is Bautista to return home ambulating with single point cane.   Follow Up Recommendations  Home health PT (Refuses HH PT; OP PT [facility] or [facility] )     Equipment Recommendations  Rolling walker with 5" wheels (Pt refusing at times and agrees other times)    Recommendations for Other Services       Precautions / Restrictions Precautions Precautions: Anterior Hip Precaution Booklet Issued: Yes (comment) Restrictions Weight Bearing Restrictions: No LLE Weight Bearing: Weight bearing as tolerated    Mobility  Bed Mobility               General bed mobility comments: Pt received upright at EOB and left in recliner  Transfers Overall transfer level: Needs assistance Equipment used: Rolling walker (2 wheeled) Transfers: Sit to/from Stand Sit to Stand: Modified independent (Device/Increase time)         General transfer comment: Pt demonstrates good hand placement, safety, and stability during transfer.   Ambulation/Gait Ambulation/Gait assistance: Modified independent (Device/Increase time) Ambulation Distance (Feet): 250 Feet Assistive device: Rolling walker (2 wheeled);Straight cane;None (Pt progresses to no assistive device)     Gait velocity interpretation: >2.62 ft/sec, indicative of  independent community ambulator General Gait Details: Pt demonstrates improving sequencing with walker and progresses to single point cane. He is actually able to progress to no assistive device for a few steps in the room. Cues to make step length symmetrical. Educated again about use of spc. Pt is Bautista to use spc at discharge.   Stairs   Stairs assistance: Supervision Stair Management: Two rails Number of Stairs: 4 General stair comments: Good sequencing and safety noted  Wheelchair Mobility    Modified Rankin (Stroke Patients Only)       Balance     Sitting balance-Leahy Scale: Normal       Standing balance-Leahy Scale: Good                      Cognition Arousal/Alertness: Awake/alert Behavior During Therapy: WFL for tasks assessed/performed;Impulsive Overall Cognitive Status: Within Functional Limits for tasks assessed       Memory: Decreased recall of precautions (Recalls 2/3 of anterior hip position precautions)              Exercises Total Joint Exercises Hip ABduction/ADduction: Strengthening;Both;10 reps;Standing (Performed standing hip abduction x 5 bilateral) Straight Leg Raises: Strengthening;Standing;Both;10 reps Marching in Standing: Strengthening;Both;Standing;10 reps (Performed x 5 in standing as well) Standing Hip Extension: Strengthening;Both;10 reps;Standing General Exercises - Lower Extremity Mini-Sqauts: Strengthening;Both;10 reps;Standing Other Exercises Other Exercises: forward mini lunges leading with LLE x 10, L lateral mini lunges x 10    General Comments        Pertinent Vitals/Pain Pain Assessment: No/denies pain Pain Score: 0-No pain    Home Living  Prior Function            PT Goals (current goals can now be found in the care plan section) Acute Rehab PT Goals Patient Stated Goal: To go home PT Goal Formulation: With patient Time For Goal Achievement: 05/29/15 Potential to  Achieve Goals: Good Progress towards PT goals: Progressing toward goals    Frequency  BID    PT Plan Current plan remains appropriate    Co-evaluation             End of Session Equipment Utilized During Treatment: Gait belt Activity Tolerance: Patient limited by pain Patient left: with family/visitor present;in chair;with call bell/phone within reach (refuses chair alarm)     Time: 0131-4388 PT Time Calculation (min) (ACUTE ONLY): 25 min  Charges:  $Gait Training: 8-22 mins $Therapeutic Exercise: 8-22 mins                    G Codes:      Angel Bautista Marilee Ditommaso PT, DPT  Porfirio Bollier 05/16/2015, 4:21 PM

## 2015-05-16 NOTE — Care Management (Signed)
Patient/girlfriend wants to go home and per PT suspected as having sexual activity in this room. PT states patient independent with mobility and probably be okay on crutches. Patient refused rolling walker although girlfriend did not agree. Lovenox not needed; Aspirin will be picked up by girlfriend. Patient/girl[facility].

## 2015-05-16 NOTE — Progress Notes (Signed)
Physical Therapy Treatment Patient Details Name: Angel Bautista MRN: [id number] DOB: 27-Jan-1971 Today's Date: 05/16/2015    History of Present Illness This patient is a 44 year old male who came to [facility] for a L THR (anterior approach)    PT Comments    Pt demonstrates excellent improvement with therapy. He is somewhat resistant to advice of therapist and demonstrates some impulsive behavior. He was able to ambulate a full lap around RN station as well as ascend/descend 6 stairs safely. Pt had a BM in bathroom during PT session and RN was notified. Pt has met all PT barriers to discharge and is appropriate to return home when medically stable. Pt will benefit from skilled PT services to address deficits in strength, balance, and mobility in order to return to full function at home.   Follow Up Recommendations  Home health PT (Refuses HH PT, [facility] or [facility] )     Equipment Recommendations  Rolling walker with 5" wheels (Pt refusing at times and agrees other times)    Recommendations for Other Services       Precautions / Restrictions Precautions Precautions: Anterior Hip Precaution Booklet Issued: Yes (comment) Restrictions Weight Bearing Restrictions: No LLE Weight Bearing: Weight bearing as tolerated    Mobility  Bed Mobility               General bed mobility comments: Pt received upright and left upright in recliner  Transfers Overall transfer level: Needs assistance Equipment used: Rolling walker (2 wheeled) Transfers: Sit to/from Stand Sit to Stand: Supervision (CGA standing; min assist to control descent sitting in chair)         General transfer comment: Pt demonstrates good hand placement, safety, and stability during transfer. Pt escorted into bathroom and is stable with all transfers   Ambulation/Gait Ambulation/Gait assistance: Min guard Ambulation Distance (Feet): 300 Feet Assistive device: Rolling walker (2 wheeled) Gait  Pattern/deviations: Step-through pattern   Gait velocity interpretation: <1.8 ft/sec, indicative of risk for recurrent falls General Gait Details: Pt demonstrates improving WB throughout LLE with cues. Encouarged to increases step length and speed which he is abel to do successfully. Pt reports decreasing pain with ambulation. Encouraged symmetrical step length. Practiced ambulation with SPC in RUE. Education about sequencing. Pt able to ambulate approximately 20' safely, but slowly with SPC.    Stairs Stairs: Yes Stairs assistance: Min guard Stair Management: One rail Right;Step to pattern Number of Stairs: 6 General stair comments: Education regarding proper sequencing. Pt demonstrates impulsivity but is safe. During ascend pt only uses RUE for support. Pt uses both UEs on R rail during descend  Wheelchair Mobility    Modified Rankin (Stroke Patients Only)       Balance     Sitting balance-Leahy Scale: Normal       Standing balance-Leahy Scale: Good                      Cognition Arousal/Alertness: Awake/alert Behavior During Therapy: WFL for tasks assessed/performed;Impulsive Overall Cognitive Status: Within Functional Limits for tasks assessed       Memory: Decreased recall of precautions (Recalls 2/3 of anterior hip position precautions)              Exercises Total Joint Exercises Hip ABduction/ADduction: Strengthening;Both;Seated;15 reps (Performed standing hip abduction x 5 bilateral) Straight Leg Raises: Strengthening;Left;5 reps;Standing Long Arc Quad: Strengthening;Both;15 reps;Seated Knee Flexion: Strengthening;Both;15 reps;Seated Marching in Standing: Strengthening;Both;Seated;15 reps (Performed x 5 in  standing as well)    General Comments        Pertinent Vitals/Pain Pain Assessment:  (Not provided by patient) Pain Intervention(s): Premedicated before session    Home Living                      Prior Function             PT Goals (current goals can now be found in the care plan section) Acute Rehab PT Goals Patient Stated Goal: To go home PT Goal Formulation: With patient Time For Goal Achievement: 05/29/15 Potential to Achieve Goals: Good Progress towards PT goals: Progressing toward goals    Frequency  BID    PT Plan Current plan remains appropriate    Co-evaluation             End of Session Equipment Utilized During Treatment: Gait belt Activity Tolerance: Patient limited by pain Patient left: with family/visitor present;in chair;with call bell/phone within reach (refuses chair alarm. Eating breakfast)     Time: 4825-0037 PT Time Calculation (min) (ACUTE ONLY): 32 min  Charges:  $Gait Training: 8-22 mins $Therapeutic Exercise: 8-22 mins                    G Codes:      Lyndel Safe Jahvier Aldea PT, DPT   Brodan Grewell 05/16/2015, 10:54 AM

## 2015-05-16 NOTE — Progress Notes (Signed)
  Subjective: 2 Days Post-Op Procedure(s) (LRB): TOTAL HIP ARTHROPLASTY ANTERIOR APPROACH (Left) Patient reports pain as moderate.   Patient seen in rounds with Dr. Rosita KeaMenz. Patient is well, and has had no acute complaints or problems.  Still having a lot of pain, but ambulating to bathroom. Plan is to go Home after hospital stay. Negative for chest pain and shortness of breath Fever: no Gastrointestinal:negative for nausea and vomiting  Objective: Vital signs in last 24 hours: Temp:  [98.4 F (36.9 C)-99.1 F (37.3 C)] 98.9 F (37.2 C) (07/14 0435) Pulse Rate:  [102-114] 106 (07/14 0435) Resp:  [16-18] 18 (07/14 0435) BP: (141-175)/(77-93) 146/78 mmHg (07/14 0435) SpO2:  [93 %-97 %] 94 % (07/14 0435)  Intake/Output from previous day:  Intake/Output Summary (Last 24 hours) at 05/16/15 0623 Last data filed at 05/16/15 0500  Gross per 24 hour  Intake    800 ml  Output    325 ml  Net    475 ml    Intake/Output this shift: Total I/O In: 480 [P.O.:480] Out: 0   Labs:  Recent Labs  05/14/15 1602 05/15/15 0502  HGB 15.4 15.0    Recent Labs  05/14/15 1602 05/15/15 0502  WBC 6.1 6.4  RBC 4.83 4.76  HCT 44.2 43.6  PLT 139* 121*    Recent Labs  05/14/15 1602 05/15/15 0502  NA  --  137  K  --  3.8  CL  --  99*  CO2  --  29  BUN  --  14  CREATININE 1.53* 1.18  GLUCOSE  --  113*  CALCIUM  --  8.7*   No results for input(s): LABPT, INR in the last 72 hours.   EXAM General - Patient is Alert and Oriented Extremity - Neurovascular intact Dorsiflexion/Plantar flexion intact Dressing/Incision - clean, dry, good granulation tissue, healing, dressing changed with no discharge Motor Function - intact, moving foot and toes well on exam. Ambulating to bathroom  Past Medical History  Diagnosis Date  . Hypertension   . Arthritis     Assessment/Plan: 2 Days Post-Op Procedure(s) (LRB): TOTAL HIP ARTHROPLASTY ANTERIOR APPROACH (Left) Active Problems:   Primary  osteoarthritis of left hip  Estimated body mass index is 32.57 kg/(m^2) as calculated from the following:   Height as of this encounter: 5\' 7"  (1.702 m).   Weight as of this encounter: 94.348 kg (208 lb). Advance diet Up with therapy Plan for discharge tomorrow to home with HHPT  DVT Prophylaxis - Lovenox, Foot Pumps and TED hose Weight-Bearing as tolerated to left leg  Dedra Skeensodd Saida Lonon, PA-C Orthopaedic Surgery 05/16/2015, 6:23 AM

## 2015-05-16 NOTE — Progress Notes (Signed)
Pt. Ambulating around in room independently. He's cutting his alarm off and going by himself after being informed to call someone.

## 2015-05-16 NOTE — Progress Notes (Signed)
Spoke with Cranston Neighborhris Gaines, PA to be sure patient was to discharge today. Per PA patient reported that he did have a bowel movement however this was not witnessed by nursing. Per PA patient is ok to discharge.

## 2015-05-16 NOTE — Discharge Instructions (Signed)
ANTERIOR APPROACH TOTAL HIP REPLACEMENT POSTOPERATIVE DIRECTIONS   Hip Rehabilitation, Guidelines Following Surgery  The results of a hip operation are greatly improved after range of motion and muscle strengthening exercises. Follow all safety measures which are given to protect your hip. If any of these exercises cause increased pain or swelling in your joint, decrease the amount until you are comfortable again. Then slowly increase the exercises. Call your caregiver if you have problems or questions.   HOME CARE INSTRUCTIONS  Remove items at home which could result in a fall. This includes throw rugs or furniture in walking pathways.   ICE to the affected hip every three hours for 30 minutes at a time and then as needed for pain and swelling.  Continue to use ice on the hip for pain and swelling from surgery. You may notice swelling that will progress down to the foot and ankle.  This is normal after surgery.  Elevate the leg when you are not up walking on it.    Continue to use the breathing machine which will help keep your temperature down.  It is common for your temperature to cycle up and down following surgery, especially at night when you are not up moving around and exerting yourself.  The breathing machine keeps your lungs expanded and your temperature down.  Do not place pillow under knee, focus on keeping the knee straight while resting  DIET You may resume your previous home diet once your are discharged from the hospital.  DRESSING / WOUND CARE / SHOWERING You may change your dressing 3-5 days after surgery.  Then change the dressing every day with sterile gauze.  Please use good hand washing techniques before changing the dressing.  Do not use any lotions or creams on the incision until instructed by your surgeon. Keep your dressing dry with showering.  You can keep it covered and pat dry. Change the surgical dressing daily and reapply a dry dressing each time.  ACTIVITY Walk  with your walker as instructed. Use walker as long as suggested by your caregivers. Avoid periods of inactivity such as sitting longer than an hour when not asleep. This helps prevent blood clots.  You may resume a sexual relationship in one month or when given the OK by your doctor.  You may return to work once you are cleared by your doctor.  Do not drive a car for 6 weeks or until released by you surgeon.  Do not drive while taking narcotics.  WEIGHT BEARING Weight bearing as tolerated with assist device (walker, cane, etc) as directed, use it as long as suggested by your surgeon or therapist, typically at least 4-6 weeks.  POSTOPERATIVE CONSTIPATION PROTOCOL Constipation - defined medically as fewer than three stools per week and severe constipation as less than one stool per week.  One of the most common issues patients have following surgery is constipation.  Even if you have a regular bowel pattern at home, your normal regimen is likely to be disrupted due to multiple reasons following surgery.  Combination of anesthesia, postoperative narcotics, change in appetite and fluid intake all can affect your bowels.  In order to avoid complications following surgery, here are some recommendations in order to help you during your recovery period.  Colace (docusate) - Pick up an over-the-counter form of Colace or another stool softener and take twice a day as long as you are requiring postoperative pain medications.  Take with a full glass of water daily.  If   you experience loose stools or diarrhea, hold the colace until you stool forms back up.  If your symptoms do not get better within 1 week or if they get worse, check with your doctor.  Dulcolax (bisacodyl) - Pick up over-the-counter and take as directed by the product packaging as needed to assist with the movement of your bowels.  Take with a full glass of water.  Use this product as needed if not relieved by Colace only.   MiraLax  (polyethylene glycol) - Pick up over-the-counter to have on hand.  MiraLax is a solution that will increase the amount of water in your bowels to assist with bowel movements.  Take as directed and can mix with a glass of water, juice, soda, coffee, or tea.  Take if you go more than two days without a movement. Do not use MiraLax more than once per day. Call your doctor if you are still constipated or irregular after using this medication for 7 days in a row.  If you continue to have problems with postoperative constipation, please contact the office for further assistance and recommendations.  If you experience "the worst abdominal pain ever" or develop nausea or vomiting, please contact the office immediatly for further recommendations for treatment.  ITCHING  If you experience itching with your medications, try taking only a single pain pill, or even half a pain pill at a time.  You can also use Benadryl over the counter for itching or also to help with sleep.   TED HOSE STOCKINGS Wear the elastic stockings on both legs for three weeks following surgery during the day but you may remove then at night for sleeping.  MEDICATIONS See your medication summary on the "After Visit Summary" that the nursing staff will review with you prior to discharge.  You may have some home medications which will be placed on hold until you complete the course of blood thinner medication.  It is important for you to complete the blood thinner medication as prescribed by your surgeon.  Continue your approved medications as instructed at time of discharge.  PRECAUTIONS If you experience chest pain or shortness of breath - call 911 immediately for transfer to the hospital emergency department.  If you develop a fever greater that 101 F, purulent drainage from wound, increased redness or drainage from wound, foul odor from the wound/dressing, or calf pain - CONTACT YOUR SURGEON.                                                    FOLLOW-UP APPOINTMENTS Make sure you keep all of your appointments after your operation with your surgeon and caregivers. You should call the office at the above phone number and make an appointment for approximately two weeks after the date of your surgery or on the date instructed by your surgeon outlined in the "After Visit Summary".  RANGE OF MOTION AND STRENGTHENING EXERCISES  These exercises are designed to help you keep full movement of your hip joint. Follow your caregiver's or physical therapist's instructions. Perform all exercises about fifteen times, three times per day or as directed. Exercise both hips, even if you have had only one joint replacement. These exercises can be done on a training (exercise) mat, on the floor, on a table or on a bed. Use whatever works the best and   is most comfortable for you. Use music or television while you are exercising so that the exercises are a pleasant break in your day. This will make your life better with the exercises acting as a break in routine you can look forward to.  Lying on your back, slowly slide your foot toward your buttocks, raising your knee up off the floor. Then slowly slide your foot back down until your leg is straight again.  Lying on your back spread your legs as far apart as you can without causing discomfort.  Lying on your side, raise your upper leg and foot straight up from the floor as far as is comfortable. Slowly lower the leg and repeat.  Lying on your back, tighten up the muscle in the front of your thigh (quadriceps muscles). You can do this by keeping your leg straight and trying to raise your heel off the floor. This helps strengthen the largest muscle supporting your knee.  Lying on your back, tighten up the muscles of your buttocks both with the legs straight and with the knee bent at a comfortable angle while keeping your heel on the floor.   IF YOU ARE TRANSFERRED TO A SKILLED REHAB FACILITY If the patient is  transferred to a skilled rehab facility following release from the hospital, a list of the current medications will be sent to the facility for the patient to continue.  When discharged from the skilled rehab facility, please have the facility set up the patient's Home Health Physical Therapy prior to being released. Also, the skilled facility will be responsible for providing the patient with their medications at time of release from the facility to include their pain medication, the muscle relaxants, and their blood thinner medication. If the patient is still at the rehab facility at time of the two week follow up appointment, the skilled rehab facility will also need to assist the patient in arranging follow up appointment in our office and any transportation needs.  MAKE SURE YOU:  Understand these instructions.  Get help right away if you are not doing well or get worse.    Pick up stool softner and laxative for home use following surgery while on pain medications. Do not submerge incision under water. Please use good hand washing techniques while changing dressing each day. May shower starting three days after surgery. Please use a clean towel to pat the incision dry following showers. Continue to use ice for pain and swelling after surgery. Do not use any lotions or creams on the incision until instructed by your surgeon. 

## 2015-05-16 NOTE — Discharge Summary (Signed)
Physician Discharge Summary  Patient ID: Angel Bautista MRN: [id number] DOB/AGE: [birth year] 46 y.o.  Admit date: 05/14/2015 Discharge date: 05/16/2015  Admission Diagnoses:  LEFT HIP OSTEOARTHRITIS   Discharge Diagnoses: Patient Active Problem List   Diagnosis Date Noted  . Primary osteoarthritis of left hip 05/14/2015  . DEPRESSION 08/05/2007  . GERD 08/05/2007    Past Medical History  Diagnosis Date  . Hypertension   . Arthritis      Transfusion: none   Consultants (if any):  none  Discharged Condition: Improved  Hospital Course: Angel Bautista is an 44 y.o. male who was admitted 05/14/2015 with a diagnosis of left hip OA and went to the operating room on 05/14/2015 and underwent the above named procedures.    Surgeries: Procedure(s): TOTAL HIP ARTHROPLASTY ANTERIOR APPROACH on 05/14/2015 Patient tolerated the surgery well. Taken to PACU where she was stabilized and then transferred to the orthopedic floor.  Started on Lovenox 30 q 12 hrs. Foot pumps applied bilaterally at 80 mm. Heels elevated on bed with rolled towels. No evidence of DVT. Negative Homan. Physical therapy started on day #1 for gait training and transfer. OT started day #1 for ADL and assisted devices.  Patient's foley was d/c on day #1. Patient's IV and hemovac was d/c on day #2.  On post op day #2 patient was stable and ready for discharge to home with HHPT.  Implants: Versafit fit cup DM 56 mm with liner M 28 mm head and a 3 lateralized AMIS stem  He was given perioperative antibiotics:  Anti-infectives    Start     Dose/Rate Route Frequency Ordered Stop   05/15/15 0030  vancomycin (VANCOCIN) IVPB 1000 mg/200 mL premix     1,000 mg 200 mL/hr over 60 Minutes Intravenous Every 12 hours 05/14/15 1553 05/15/15 0118   05/14/15 1200  vancomycin (VANCOCIN) IVPB 1000 mg/200 mL premix     1,000 mg 200 mL/hr over 60 Minutes Intravenous  Once 05/14/15 1146 05/14/15 1312   05/14/15 1145  vancomycin  (VANCOCIN) 1,000 mg in sodium chloride 0.9 % 250 mL IVPB  Status:  Discontinued     1,000 mg 250 mL/hr over 60 Minutes Intravenous  Once 05/14/15 1141 05/14/15 1330   05/14/15 1145  clindamycin (CLEOCIN) IVPB 900 mg  Status:  Discontinued     900 mg 100 mL/hr over 30 Minutes Intravenous  Once 05/14/15 1141 05/14/15 1538   05/14/15 0944  clindamycin (CLEOCIN) 900 MG/50ML IVPB    Comments:  HENRY, LOIS: cabinet override      05/14/15 0944 05/14/15 1300    .  He was given sequential compression devices, early ambulation, and aspirin for DVT prophylaxis.  He benefited maximally from the hospital stay and there were no complications.    Recent vital signs:  Filed Vitals:   05/16/15 0807  BP: 133/79  Pulse: 89  Temp: 98.3 F (36.8 C)  Resp: 18    Recent laboratory studies:  Lab Results  Component Value Date   HGB 14.5 05/16/2015   HGB 15.0 05/15/2015   HGB 15.4 05/14/2015   Lab Results  Component Value Date   WBC 7.9 05/16/2015   PLT 117* 05/16/2015   Lab Results  Component Value Date   INR 0.99 05/07/2015   Lab Results  Component Value Date   NA 134* 05/16/2015   K 4.0 05/16/2015   CL 95* 05/16/2015   CO2 31 05/16/2015   BUN 16 05/16/2015   CREATININE 1.23 05/16/2015  GLUCOSE 138* 05/16/2015    Discharge Medications:     Medication List    TAKE these medications        celecoxib 200 MG capsule  Commonly known as:  CELEBREX  Take 1 capsule (200 mg total) by mouth 2 (two) times daily.     diclofenac 75 MG EC tablet  Commonly known as:  VOLTAREN  Take 75 mg by mouth 2 (two) times daily.     enoxaparin 40 MG/0.4ML injection  Commonly known as:  LOVENOX  Inject 0.4 mLs (40 mg total) into the skin every 12 (twelve) hours.     HYDROcodone-acetaminophen 5-325 MG per tablet  Commonly known as:  NORCO/VICODIN  Take 1-2 tablets by mouth every 4 (four) hours as needed for moderate pain.     ibuprofen 100 MG tablet  Commonly known as:  ADVIL,MOTRIN  Take  200 mg by mouth every 6 (six) hours.     traMADol 50 MG tablet  Commonly known as:  ULTRAM  Take 50 mg by mouth every 6 (six) hours as needed.        Diagnostic Studies: Dg Hip Operative Unilat With Pelvis Left  05/14/2015   CLINICAL DATA:  Left hip osteoarthritis.  Left hip arthroplasty.  EXAM: OPERATIVE LEFT HIP (WITH PELVIS IF PERFORMED) 5 VIEWS  COMPARISON:  None.  FINDINGS: Initial intraoperative view shows severe left hip osteoarthritis. Subsequent images show placement of a left hip prosthesis in appropriate position. No evidence of fracture or dislocation.  IMPRESSION: Expected appearance of left hip prosthesis. No evidence of fracture or dislocation.   Electronically Signed   By: Myles Rosenthal M.D.   On: 05/14/2015 14:36   Dg Hip Unilat With Pelvis 2-3 Views Left  05/14/2015   CLINICAL DATA:  Postop pain.  Status post left hip arthroplasty.  EXAM: DG HIP (WITH OR WITHOUT PELVIS) 2-3V LEFT  COMPARISON:  None.  FINDINGS: Left hip prosthesis is seen in appropriate position. No evidence of fracture or dislocation. Overlying skin staples noted.  IMPRESSION: Left hip prosthesis in appropriate position. No evidence of fracture or dislocation.   Electronically Signed   By: Myles Rosenthal M.D.   On: 05/14/2015 15:09    Disposition: Discharged to home with home health PT       Signed: Patience Musca 05/16/2015, 11:57 AM

## 2015-05-17 LAB — SURGICAL PATHOLOGY

## 2015-06-02 ENCOUNTER — Emergency Department: Payer: Self-pay

## 2015-06-02 ENCOUNTER — Encounter: Payer: Self-pay | Admitting: *Deleted

## 2015-06-02 ENCOUNTER — Emergency Department
Admission: EM | Admit: 2015-06-02 | Discharge: 2015-06-02 | Disposition: A | Discharge status: Home / Self Care | Payer: Self-pay | Attending: Emergency Medicine | Admitting: Emergency Medicine

## 2015-06-02 DIAGNOSIS — Z96642 Presence of left artificial hip joint: Secondary | ICD-10-CM | POA: Insufficient documentation

## 2015-06-02 DIAGNOSIS — M25552 Pain in left hip: Secondary | ICD-10-CM | POA: Insufficient documentation

## 2015-06-02 DIAGNOSIS — Z7982 Long term (current) use of aspirin: Secondary | ICD-10-CM | POA: Insufficient documentation

## 2015-06-02 DIAGNOSIS — Z79899 Other long term (current) drug therapy: Secondary | ICD-10-CM | POA: Insufficient documentation

## 2015-06-02 DIAGNOSIS — Z720 Tobacco use: Secondary | ICD-10-CM | POA: Insufficient documentation

## 2015-06-02 DIAGNOSIS — I10 Essential (primary) hypertension: Secondary | ICD-10-CM | POA: Insufficient documentation

## 2015-06-02 DIAGNOSIS — Z791 Long term (current) use of non-steroidal anti-inflammatories (NSAID): Secondary | ICD-10-CM | POA: Insufficient documentation

## 2015-06-02 LAB — COMPREHENSIVE METABOLIC PANEL
ALK PHOS: 71 U/L (ref 38–126)
ALT: 20 U/L (ref 17–63)
AST: 23 U/L (ref 15–41)
Albumin: 4.1 g/dL (ref 3.5–5.0)
Anion gap: 7 (ref 5–15)
BILIRUBIN TOTAL: 0.6 mg/dL (ref 0.3–1.2)
BUN: 16 mg/dL (ref 6–20)
CHLORIDE: 103 mmol/L (ref 101–111)
CO2: 27 mmol/L (ref 22–32)
Calcium: 8.9 mg/dL (ref 8.9–10.3)
Creatinine, Ser: 0.92 mg/dL (ref 0.61–1.24)
Glucose, Bld: 97 mg/dL (ref 65–99)
Potassium: 4.4 mmol/L (ref 3.5–5.1)
Sodium: 137 mmol/L (ref 135–145)
Total Protein: 7 g/dL (ref 6.5–8.1)

## 2015-06-02 LAB — CBC
HCT: 41.9 % (ref 40.0–52.0)
HEMOGLOBIN: 14.4 g/dL (ref 13.0–18.0)
MCH: 30.7 pg (ref 26.0–34.0)
MCHC: 34.4 g/dL (ref 32.0–36.0)
MCV: 89.3 fL (ref 80.0–100.0)
Platelets: 200 10*3/uL (ref 150–440)
RBC: 4.69 MIL/uL (ref 4.40–5.90)
RDW: 12.6 % (ref 11.5–14.5)
WBC: 4.4 10*3/uL (ref 3.8–10.6)

## 2015-06-02 LAB — SEDIMENTATION RATE: Sed Rate: 7 mm/hr (ref 0–15)

## 2015-06-02 MED ORDER — HYDROCODONE-ACETAMINOPHEN 5-325 MG PO TABS: 1.0000 | ORAL_TABLET | ORAL | Status: DC | PRN

## 2015-06-02 NOTE — ED Notes (Signed)
States he developed pain to left hip pain since Thursday pm  S/p hip replacement 07/12

## 2015-06-02 NOTE — ED Provider Notes (Signed)
[facility] Emergency Department Provider Note  ____________________________________________  Time seen: On arrival  I have reviewed the triage vital signs and the nursing notes.   HISTORY  Chief Complaint Hip Pain    HPI Angel Bautista is a 44 y.o. male who presents with left hip pain. Patient had a hip replacement on July 12 by Dr. Rosita Kea. He reports he has been doing well but approximately 4 days ago he went to the gym and was careful not to put any strain on his left hip but afterwards he reports worsening pain. He reports the pain was severe last night but is improved with pain medication. He is able to ambulate with his cane. He denies fevers chills. No nausea no vomiting. No redness. No discharge from the incision site.     Past Medical History  Diagnosis Date  . Hypertension   . Arthritis     Patient Active Problem List   Diagnosis Date Noted  . Primary osteoarthritis of left hip 05/14/2015  . DEPRESSION 08/05/2007  . GERD 08/05/2007    Past Surgical History  Procedure Laterality Date  . Joint replacement    . Total hip arthroplasty Left 05/14/2015    Procedure: TOTAL HIP ARTHROPLASTY ANTERIOR APPROACH;  Surgeon: Kennedy Bucker, MD;  Location: [facility] ORS;  Service: Orthopedics;  Laterality: Left;    Current Outpatient Rx  Name  Route  Sig  Dispense  Refill  . aspirin EC 325 MG tablet   Oral   Take 1 tablet (325 mg total) by mouth daily.   30 tablet   0   . celecoxib (CELEBREX) 200 MG capsule   Oral   Take 1 capsule (200 mg total) by mouth 2 (two) times daily.   60 capsule   1   . diclofenac (VOLTAREN) 75 MG EC tablet   Oral   Take 75 mg by mouth 2 (two) times daily.         Marland Kitchen HYDROcodone-acetaminophen (NORCO/VICODIN) 5-325 MG per tablet   Oral   Take 1-2 tablets by mouth every 4 (four) hours as needed for moderate pain.   60 tablet   0   . ibuprofen (ADVIL,MOTRIN) 100 MG tablet   Oral   Take 200 mg by mouth every 6 (six)  hours.         . traMADol (ULTRAM) 50 MG tablet   Oral   Take 50 mg by mouth every 6 (six) hours as needed.           Allergies Tramadol and Penicillins  Family History  Problem Relation Age of Onset  . Other Neg Hx     Social History History  Substance Use Topics  . Smoking status: Current Every Day Smoker -- 1.00 packs/day for 15 years    Types: Cigarettes  . Smokeless tobacco: Not on file  . Alcohol Use: 2.4 oz/week    4 Cans of beer per week     Comment: daily    Review of Systems  Constitutional: Negative for fever. Eyes: Negative for visual changes. ENT: Negative for sore throat Cardiovascular: Negative for chest pain. Respiratory: Negative for shortness of breath. Gastrointestinal: Negative for abdominal pain, vomiting and diarrhea. Genitourinary: Negative for dysuria. Musculoskeletal: Operative for left hip pain Skin: Negative for rash. Neurological: Negative for headaches or focal weakness Psychiatric: No anxiety    ____________________________________________   PHYSICAL EXAM:  VITAL SIGNS: ED Triage Vitals  Enc Vitals Group     BP 06/02/15 1043 182/101 mmHg  Pulse Rate 06/02/15 1043 101     Resp 06/02/15 1043 17     Temp 06/02/15 1043 98.2 F (36.8 C)     Temp Source 06/02/15 1043 Oral     SpO2 06/02/15 1043 98 %     Weight 06/02/15 1043 205 lb (92.987 kg)     Height 06/02/15 1043 5\' 11"  (1.803 m)     Head Cir --      Peak Flow --      Pain Score 06/02/15 1044 2     Pain Loc --      Pain Edu? --      Excl. in GC? --      Constitutional: Alert and oriented. Well appearing and in no distress. Eyes: Conjunctivae are normal.  ENT   Head: Normocephalic and atraumatic.   Mouth/Throat: Mucous membranes are moist. Cardiovascular: Normal rate, regular rhythm. Normal and symmetric distal pulses are present in all extremities. No murmurs, rubs, or gallops. Respiratory: Normal respiratory effort without tachypnea nor retractions.  Breath sounds are clear and equal bilaterally.  Gastrointestinal: Soft and non-tender in all quadrants. No distention. There is no CVA tenderness. Genitourinary: deferred Musculoskeletal: Incision site clean and dry and intact. No erythema no tenderness to discharge. Full range of motion of left hip including flexion and extension. 2+ distal pulses Neurologic:  Normal speech and language. No gross focal neurologic deficits are appreciated. Skin:  Skin is warm, dry and intact. No rash noted. Psychiatric: Mood and affect are normal. Patient exhibits appropriate insight and judgment.  ____________________________________________    LABS (pertinent positives/negatives)  Labs Reviewed  CBC  COMPREHENSIVE METABOLIC PANEL  SEDIMENTATION RATE    ____________________________________________   EKG  None  ____________________________________________    RADIOLOGY I have personally reviewed any xrays that were ordered on this patient: Hip x-ray unremarkable  ____________________________________________   PROCEDURES  Procedure(s) performed: none  Critical Care performed: none  ____________________________________________   INITIAL IMPRESSION / ASSESSMENT AND PLAN / ED COURSE  Pertinent labs & imaging results that were available during my care of the patient were reviewed by me and considered in my medical decision making (see chart for details).  Labs and x-ray are unremarkable. Discussed with Dr. Ernest Pine who recommended using crutches and calling office tomorrow.  ____________________________________________   FINAL CLINICAL IMPRESSION(S) / ED DIAGNOSES  Final diagnoses:  Hip pain, acute, left     Jene Every, MD 06/02/15 [phone]

## 2015-06-02 NOTE — ED Notes (Signed)
Pt had left hip replaced on the 12th, pt was cleared to go back to the gym. Pt went to the gym on Thursday and later that night he was in severe pain. Pt continues have pain

## 2015-06-02 NOTE — ED Notes (Signed)
Pt in xr

## 2015-06-02 NOTE — Discharge Instructions (Signed)

## 2016-08-04 IMAGING — XA DG HIP (WITH PELVIS) OPERATIVE*L*
2 series · 2 of 2 positions shown · non-contrast
Comparison: None.

CLINICAL DATA: Left hip osteoarthritis.  Left hip arthroplasty.

EXAM:
OPERATIVE LEFT HIP (WITH PELVIS IF PERFORMED) 5 VIEWS

[Series 6001: 1 · 1 of 1 slices shown]
[im 1/1]
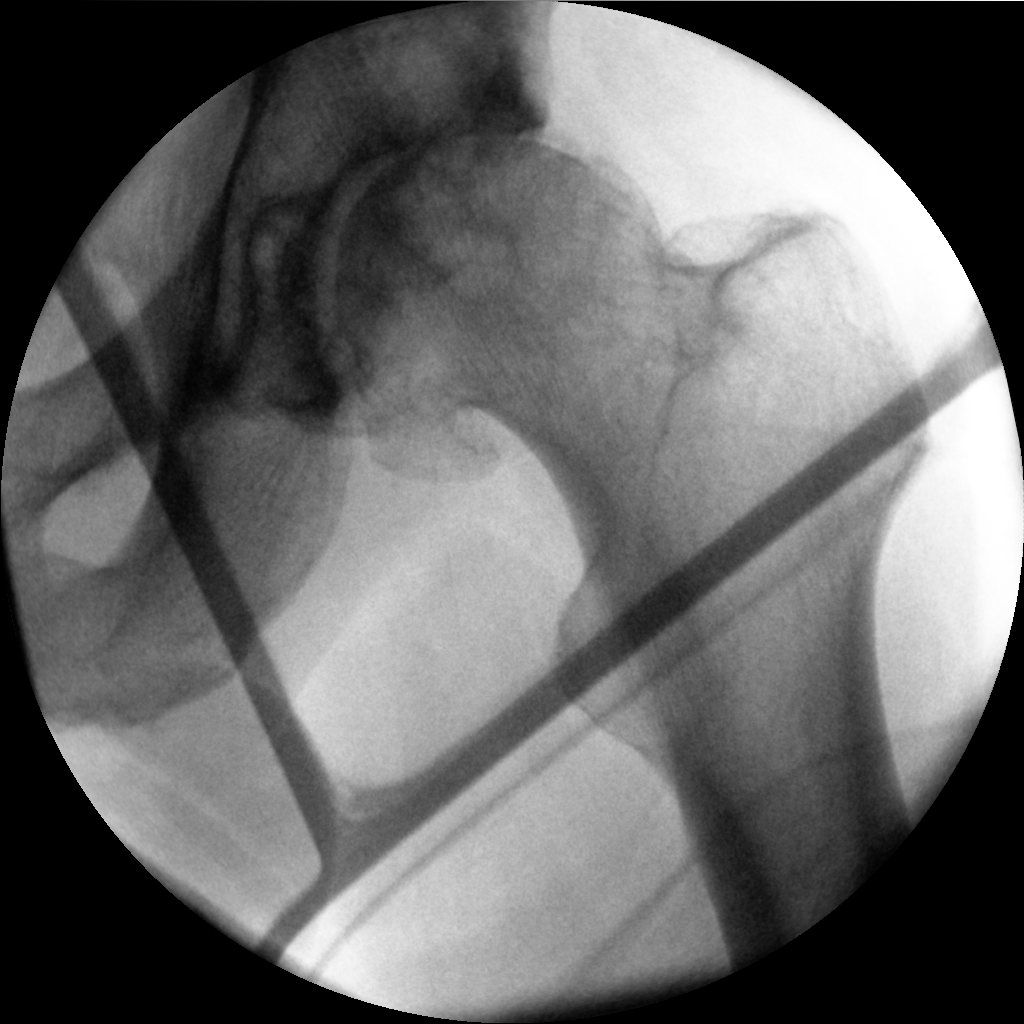

[Series 6004: 4 · 1 of 1 slices shown]
[im 1/1]
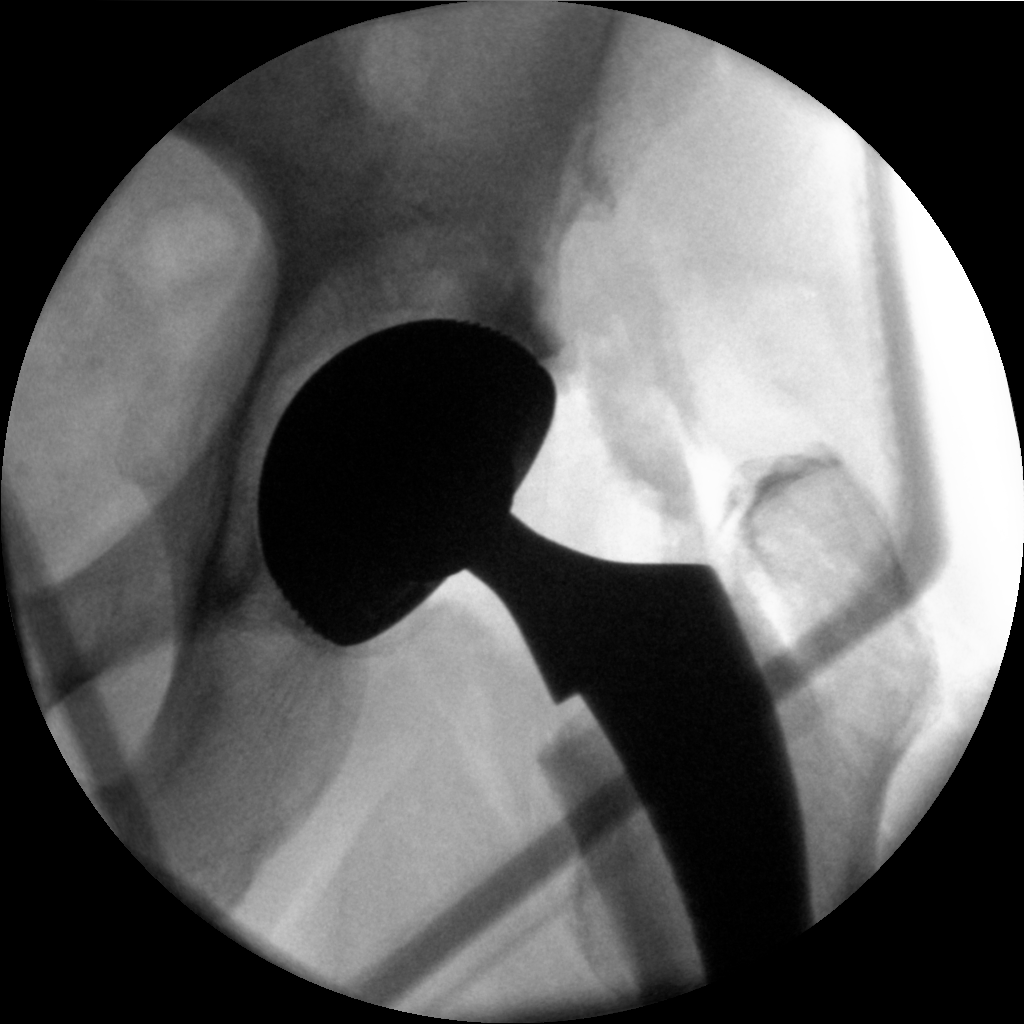

[2 of 2 positions shown; findings below may reference images not displayed]

FINDINGS: Initial intraoperative view shows severe left hip osteoarthritis.
Subsequent images show placement of a left hip prosthesis in
appropriate position. No evidence of fracture or dislocation.
IMPRESSION: Expected appearance of left hip prosthesis. No evidence of fracture
or dislocation.

## 2016-08-04 IMAGING — CR DG HIP (WITH OR WITHOUT PELVIS) 2-3V*L*
1 series · 2 of 2 positions shown · non-contrast
Comparison: None.

CLINICAL DATA: Postop pain.  Status post left hip arthroplasty.

EXAM:
DG HIP (WITH OR WITHOUT PELVIS) 2-3V LEFT

[Series 1: ap · 0.17mm/px · 2 of 2 slices shown]
[im 1/2]
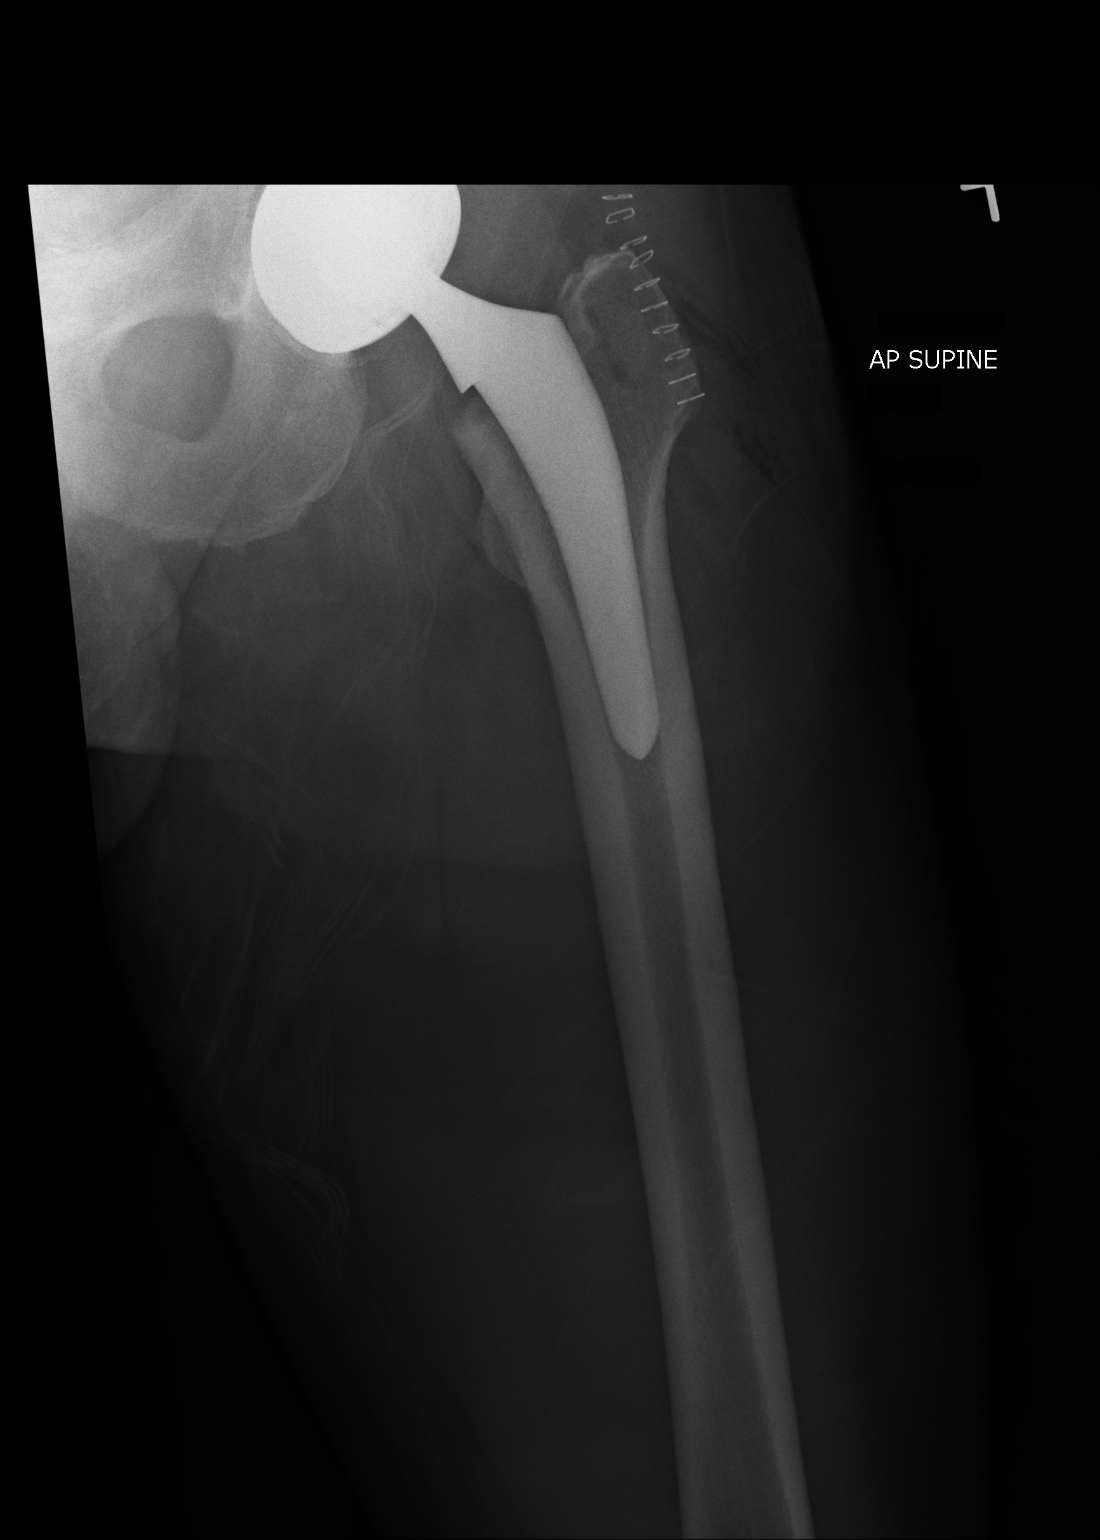
[im 2/2]
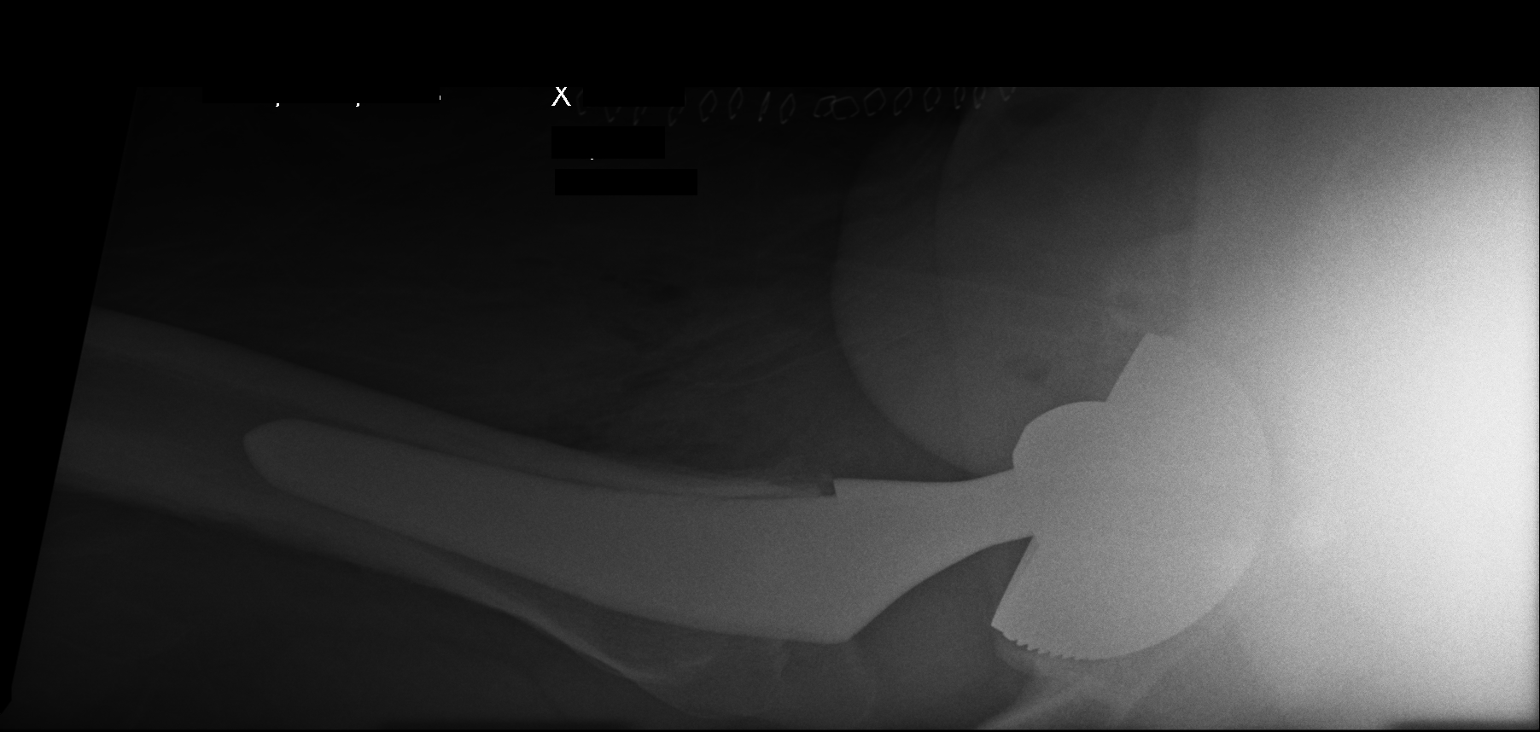

[2 of 2 positions shown; findings below may reference images not displayed]

FINDINGS: Left hip prosthesis is seen in appropriate position. No evidence of
fracture or dislocation. Overlying skin staples noted.
IMPRESSION: Left hip prosthesis in appropriate position. No evidence of fracture
or dislocation.

## 2016-09-28 ENCOUNTER — Telehealth: Payer: Self-pay | Admitting: Family Medicine

## 2016-09-28 NOTE — Telephone Encounter (Signed)
[facility]

## 2016-09-28 NOTE — Telephone Encounter (Signed)
LMTCB  [facility] Dr Taw[facility] Cool[facility]odd[facility]

## 2016-09-28 NOTE — Telephone Encounter (Signed)
Message routed to Dr Tawanna Coolerodd for response.  Thanks!

## 2016-09-28 NOTE — Telephone Encounter (Signed)
Pt does not want to see anyone except dr Tawanna Coolerodd. Pt will call back if he changes his mind.

## 2016-09-28 NOTE — Telephone Encounter (Signed)
It has been over 10 years since pt last seen dr todd and would like to re-est. Pt is aware md not accept new pt

## 2016-09-29 NOTE — Telephone Encounter (Signed)
Noted  

## 2016-11-18 ENCOUNTER — Inpatient Hospital Stay: Admission: RE | Admit: 2016-11-18 | Payer: Self-pay | Source: Ambulatory Visit

## 2016-11-19 ENCOUNTER — Encounter
Admission: RE | Admit: 2016-11-19 | Discharge: 2016-11-19 | Disposition: A | Discharge status: Home / Self Care | Payer: Managed Care, Other (non HMO) | Source: Ambulatory Visit | Attending: Orthopedic Surgery | Admitting: Orthopedic Surgery

## 2016-11-19 DIAGNOSIS — Z01818 Encounter for other preprocedural examination: Secondary | ICD-10-CM | POA: Diagnosis not present

## 2016-11-19 HISTORY — DX: Palpitations: R00.2

## 2016-11-19 LAB — CBC
HEMATOCRIT: 49.1 % (ref 40.0–52.0)
Hemoglobin: 17.2 g/dL (ref 13.0–18.0)
MCH: 31.6 pg (ref 26.0–34.0)
MCHC: 35 g/dL (ref 32.0–36.0)
MCV: 90.2 fL (ref 80.0–100.0)
PLATELETS: 181 10*3/uL (ref 150–440)
RBC: 5.45 MIL/uL (ref 4.40–5.90)
RDW: 13 % (ref 11.5–14.5)
WBC: 6 10*3/uL (ref 3.8–10.6)

## 2016-11-19 LAB — BASIC METABOLIC PANEL
Anion gap: 9 (ref 5–15)
BUN: 17 mg/dL (ref 6–20)
CHLORIDE: 100 mmol/L — AB (ref 101–111)
CO2: 28 mmol/L (ref 22–32)
CREATININE: 1.24 mg/dL (ref 0.61–1.24)
Calcium: 9.6 mg/dL (ref 8.9–10.3)
GFR calc Af Amer: >60 mL/min (ref >60)
GFR calc non Af Amer: >60 mL/min (ref >60)
Glucose, Bld: 91 mg/dL (ref 65–99)
Potassium: 3.8 mmol/L (ref 3.5–5.1)
SODIUM: 137 mmol/L (ref 135–145)

## 2016-11-19 LAB — URINALYSIS, COMPLETE (UACMP) WITH MICROSCOPIC
BACTERIA UA: NONE SEEN
BILIRUBIN URINE: NEGATIVE
Glucose, UA: NEGATIVE mg/dL
Hgb urine dipstick: NEGATIVE
KETONES UR: NEGATIVE mg/dL
LEUKOCYTES UA: NEGATIVE
NITRITE: NEGATIVE
PROTEIN: NEGATIVE mg/dL
RBC / HPF: NONE SEEN RBC/hpf (ref 0–5)
Specific Gravity, Urine: 1.009 (ref 1.005–1.030)
Squamous Epithelial / LPF: NONE SEEN
WBC UA: NONE SEEN WBC/hpf (ref 0–5)
pH: 6 (ref 5.0–8.0)

## 2016-11-19 LAB — TYPE AND SCREEN
ABO/RH(D): B NEG
Antibody Screen: NEGATIVE

## 2016-11-19 LAB — SEDIMENTATION RATE: Sed Rate: 1 mm/hr (ref 0–15)

## 2016-11-19 LAB — PROTIME-INR
INR: 1
Prothrombin Time: 13.2 seconds (ref 11.4–15.2)

## 2016-11-19 LAB — SURGICAL PCR SCREEN
MRSA, PCR: NEGATIVE
STAPHYLOCOCCUS AUREUS: NEGATIVE

## 2016-11-19 LAB — APTT: aPTT: 28 seconds (ref 24–36)

## 2016-11-19 NOTE — Patient Instructions (Signed)
  Your procedure is scheduled on: 11/24/16 Tues Report to Same Day Surgery 2nd floor medical mall [facility](Medical Mall Entrance-take elevator on left to 2nd floor.  Check in with surgery information desk.) To find out your arrival time please call [phone] between 1PM - 3PM on 11/23/16 Mon  Remember: Instructions that are not followed completely may result in serious medical risk, up to and including death, or upon the discretion of your surgeon and anesthesiologist your surgery may need to be rescheduled.    _x___ 1. Do not eat food or drink liquids after midnight. No gum chewing or hard candies.     __x__ 2. No Alcohol for 24 hours before or after surgery.   __x__3. No Smoking for 24 prior to surgery.   ____  4. Bring all medications with you on the day of surgery if instructed.    __x__ 5. Notify your doctor if there is any change in your medical condition     (cold, fever, infections).     Do not wear jewelry, make-up, hairpins, clips or nail polish.  Do not wear lotions, powders, or perfumes. You may wear deodorant.  Do not shave 48 hours prior to surgery. Men may shave face and neck.  Do not bring valuables to the hospital.    [facility] is not responsible for any belongings or valuables.               Contacts, dentures or bridgework may not be worn into surgery.  Leave your suitcase in the car. After surgery it may be brought to your room.  For patients admitted to the hospital, discharge time is determined by your treatment team.   Patients discharged the day of surgery will not be allowed to drive home.  You will need someone to drive you home and stay with you the night of your procedure.    Please read over the following fact sheets that you were given:   [facility] Preparing for Surgery and or MRSA Information   _x___ Take these medicines the morning of surgery with A SIP OF WATER:    1. None  2.  3.  4.  5.  6.  ____Fleets enema or Magnesium Citrate as  directed.   _x___ Use CHG Soap or sage wipes as directed on instruction sheet   ____ Use inhalers on the day of surgery and bring to hospital day of surgery  ____ Stop metformin 2 days prior to surgery    ____ Take 1/2 of usual insulin dose the night before surgery and none on the morning of           surgery.   ____ Stop Aspirin, Coumadin, Pllavix ,Eliquis, Effient, or Pradaxa  x__ Stop Anti-inflammatories such as Advil, Aleve, Ibuprofen, Motrin, Naproxen,          Naprosyn, Goodies powders or aspirin products. Ok to take Tylenol.   ____ Stop supplements until after surgery.    ____ Bring C-Pap to the hospital.

## 2016-11-20 LAB — URINE CULTURE: CULTURE: NO GROWTH

## 2016-11-23 MED ORDER — CLINDAMYCIN PHOSPHATE 900 MG/50ML IV SOLN
900.0000 mg | Freq: Once | INTRAVENOUS | Status: AC
  Administered 2016-11-24: 900 mg via INTRAVENOUS

## 2016-11-23 MED ORDER — TRANEXAMIC ACID 1000 MG/10ML IV SOLN
1000.0000 mg | INTRAVENOUS | Status: AC
  Administered 2016-11-24: 1000 mg via INTRAVENOUS
  Filled 2016-11-23: qty 10

## 2016-11-24 ENCOUNTER — Inpatient Hospital Stay: Payer: Managed Care, Other (non HMO)

## 2016-11-24 ENCOUNTER — Inpatient Hospital Stay: Payer: Managed Care, Other (non HMO) | Admitting: Anesthesiology

## 2016-11-24 ENCOUNTER — Inpatient Hospital Stay
Admission: RE | Admit: 2016-11-24 | Discharge: 2016-11-26 | DRG: 470 | Disposition: A | Discharge status: Home Health | Payer: Managed Care, Other (non HMO) | Source: Ambulatory Visit | Attending: Orthopedic Surgery | Admitting: Orthopedic Surgery

## 2016-11-24 ENCOUNTER — Encounter
Admission: RE | Disposition: A | Discharge status: Home Health | Payer: Self-pay | Source: Ambulatory Visit | Attending: Orthopedic Surgery

## 2016-11-24 DIAGNOSIS — Z96642 Presence of left artificial hip joint: Secondary | ICD-10-CM | POA: Diagnosis present

## 2016-11-24 DIAGNOSIS — Z8049 Family history of malignant neoplasm of other genital organs: Secondary | ICD-10-CM | POA: Diagnosis not present

## 2016-11-24 DIAGNOSIS — Z811 Family history of alcohol abuse and dependence: Secondary | ICD-10-CM | POA: Diagnosis not present

## 2016-11-24 DIAGNOSIS — Z823 Family history of stroke: Secondary | ICD-10-CM

## 2016-11-24 DIAGNOSIS — M1611 Unilateral primary osteoarthritis, right hip: Secondary | ICD-10-CM | POA: Diagnosis present

## 2016-11-24 DIAGNOSIS — I10 Essential (primary) hypertension: Secondary | ICD-10-CM | POA: Diagnosis present

## 2016-11-24 DIAGNOSIS — M25551 Pain in right hip: Secondary | ICD-10-CM | POA: Diagnosis present

## 2016-11-24 DIAGNOSIS — G8918 Other acute postprocedural pain: Secondary | ICD-10-CM

## 2016-11-24 DIAGNOSIS — K219 Gastro-esophageal reflux disease without esophagitis: Secondary | ICD-10-CM | POA: Diagnosis present

## 2016-11-24 DIAGNOSIS — Z419 Encounter for procedure for purposes other than remedying health state, unspecified: Secondary | ICD-10-CM

## 2016-11-24 DIAGNOSIS — Z880 Allergy status to penicillin: Secondary | ICD-10-CM

## 2016-11-24 DIAGNOSIS — R2681 Unsteadiness on feet: Secondary | ICD-10-CM

## 2016-11-24 DIAGNOSIS — F17210 Nicotine dependence, cigarettes, uncomplicated: Secondary | ICD-10-CM | POA: Diagnosis present

## 2016-11-24 DIAGNOSIS — Z7982 Long term (current) use of aspirin: Secondary | ICD-10-CM

## 2016-11-24 DIAGNOSIS — Z8042 Family history of malignant neoplasm of prostate: Secondary | ICD-10-CM | POA: Diagnosis not present

## 2016-11-24 DIAGNOSIS — Z885 Allergy status to narcotic agent status: Secondary | ICD-10-CM | POA: Diagnosis not present

## 2016-11-24 DIAGNOSIS — Z8249 Family history of ischemic heart disease and other diseases of the circulatory system: Secondary | ICD-10-CM | POA: Diagnosis not present

## 2016-11-24 DIAGNOSIS — M6281 Muscle weakness (generalized): Secondary | ICD-10-CM

## 2016-11-24 HISTORY — PX: TOTAL HIP ARTHROPLASTY: SHX124

## 2016-11-24 LAB — CBC
HEMATOCRIT: 40.8 % (ref 40.0–52.0)
Hemoglobin: 14.4 g/dL (ref 13.0–18.0)
MCH: 31.9 pg (ref 26.0–34.0)
MCHC: 35.3 g/dL (ref 32.0–36.0)
MCV: 90.4 fL (ref 80.0–100.0)
Platelets: 151 10*3/uL (ref 150–440)
RBC: 4.51 MIL/uL (ref 4.40–5.90)
RDW: 12.8 % (ref 11.5–14.5)
WBC: 9.2 10*3/uL (ref 3.8–10.6)

## 2016-11-24 LAB — CREATININE, SERUM
Creatinine, Ser: 1.42 mg/dL — ABNORMAL HIGH (ref 0.61–1.24)
GFR calc Af Amer: >60 mL/min (ref >60)
GFR, EST NON AFRICAN AMERICAN: 58 mL/min — AB (ref >60)

## 2016-11-24 SURGERY — ARTHROPLASTY, HIP, TOTAL, ANTERIOR APPROACH
Anesthesia: General | Site: Hip | Laterality: Right | Wound class: Clean

## 2016-11-24 MED ORDER — HYDROMORPHONE HCL 1 MG/ML IJ SOLN
INTRAMUSCULAR | Status: AC
  Filled 2016-11-24: qty 1

## 2016-11-24 MED ORDER — BUPIVACAINE-EPINEPHRINE 0.25% -1:200000 IJ SOLN
INTRAMUSCULAR | Status: DC | PRN
  Administered 2016-11-24: 30 mL

## 2016-11-24 MED ORDER — HYDROMORPHONE HCL 1 MG/ML IJ SOLN
INTRAMUSCULAR | Status: AC
  Administered 2016-11-24: 0.5 mg via INTRAVENOUS
  Filled 2016-11-24: qty 1

## 2016-11-24 MED ORDER — BUPIVACAINE-EPINEPHRINE (PF) 0.25% -1:200000 IJ SOLN
INTRAMUSCULAR | Status: AC
  Filled 2016-11-24: qty 30

## 2016-11-24 MED ORDER — KETOROLAC TROMETHAMINE 30 MG/ML IJ SOLN
INTRAMUSCULAR | Status: DC | PRN
  Administered 2016-11-24: 30 mg via INTRAVENOUS

## 2016-11-24 MED ORDER — FENTANYL CITRATE (PF) 100 MCG/2ML IJ SOLN
25.0000 ug | INTRAMUSCULAR | Status: DC | PRN
  Administered 2016-11-24 (×4): 25 ug via INTRAVENOUS

## 2016-11-24 MED ORDER — LIDOCAINE HCL (CARDIAC) 20 MG/ML IV SOLN
INTRAVENOUS | Status: DC | PRN
  Administered 2016-11-24: 100 mg via INTRAVENOUS

## 2016-11-24 MED ORDER — NICOTINE 14 MG/24HR TD PT24
14.0000 mg | MEDICATED_PATCH | Freq: Every day | TRANSDERMAL | Status: DC
  Administered 2016-11-24 – 2016-11-26 (×3): 14 mg via TRANSDERMAL
  Filled 2016-11-24 (×3): qty 1

## 2016-11-24 MED ORDER — ROCURONIUM BROMIDE 100 MG/10ML IV SOLN
INTRAVENOUS | Status: DC | PRN
  Administered 2016-11-24: 50 mg via INTRAVENOUS
  Administered 2016-11-24: 10 mg via INTRAVENOUS

## 2016-11-24 MED ORDER — HYDROMORPHONE HCL 1 MG/ML IJ SOLN
INTRAMUSCULAR | Status: DC | PRN
  Administered 2016-11-24: 1 mg via INTRAVENOUS

## 2016-11-24 MED ORDER — MIDAZOLAM HCL 2 MG/2ML IJ SOLN
INTRAMUSCULAR | Status: AC
  Filled 2016-11-24: qty 2

## 2016-11-24 MED ORDER — CLINDAMYCIN PHOSPHATE 900 MG/50ML IV SOLN
INTRAVENOUS | Status: AC
  Filled 2016-11-24: qty 50

## 2016-11-24 MED ORDER — BISACODYL 10 MG RE SUPP
10.0000 mg | Freq: Every day | RECTAL | Status: DC | PRN
  Filled 2016-11-24: qty 1

## 2016-11-24 MED ORDER — ONDANSETRON HCL 4 MG/2ML IJ SOLN
INTRAMUSCULAR | Status: DC | PRN
  Administered 2016-11-24: 4 mg via INTRAVENOUS

## 2016-11-24 MED ORDER — FAMOTIDINE 20 MG PO TABS
20.0000 mg | ORAL_TABLET | Freq: Once | ORAL | Status: AC
  Administered 2016-11-24: 20 mg via ORAL

## 2016-11-24 MED ORDER — PHENYLEPHRINE 40 MCG/ML (10ML) SYRINGE FOR IV PUSH (FOR BLOOD PRESSURE SUPPORT)
PREFILLED_SYRINGE | INTRAVENOUS | Status: AC
  Filled 2016-11-24: qty 10

## 2016-11-24 MED ORDER — FENTANYL CITRATE (PF) 100 MCG/2ML IJ SOLN
INTRAMUSCULAR | Status: DC | PRN
  Administered 2016-11-24: 250 ug via INTRAVENOUS

## 2016-11-24 MED ORDER — NEOMYCIN-POLYMYXIN B GU 40-200000 IR SOLN
Status: DC | PRN
  Administered 2016-11-24: 4 mL

## 2016-11-24 MED ORDER — KETAMINE HCL 10 MG/ML IJ SOLN
INTRAMUSCULAR | Status: AC
  Filled 2016-11-24: qty 1

## 2016-11-24 MED ORDER — KETAMINE HCL 50 MG/ML IJ SOLN
INTRAMUSCULAR | Status: DC | PRN
  Administered 2016-11-24: 40 mg via INTRAVENOUS
  Administered 2016-11-24: 60 mg via INTRAMUSCULAR

## 2016-11-24 MED ORDER — NEOMYCIN-POLYMYXIN B GU 40-200000 IR SOLN
Status: AC
  Filled 2016-11-24: qty 4

## 2016-11-24 MED ORDER — LIDOCAINE HCL (PF) 2 % IJ SOLN
INTRAMUSCULAR | Status: AC
  Filled 2016-11-24: qty 2

## 2016-11-24 MED ORDER — ONDANSETRON HCL 4 MG PO TABS
4.0000 mg | ORAL_TABLET | Freq: Four times a day (QID) | ORAL | Status: DC | PRN
Start: 2016-11-24 — End: 2016-11-26

## 2016-11-24 MED ORDER — MAGNESIUM CITRATE PO SOLN
1.0000 | Freq: Once | ORAL | Status: AC | PRN
  Administered 2016-11-26: 1 via ORAL
  Filled 2016-11-24 (×2): qty 296

## 2016-11-24 MED ORDER — SODIUM CHLORIDE 0.9 % IV SOLN
INTRAVENOUS | Status: DC
  Administered 2016-11-24: 18:00:00 via INTRAVENOUS

## 2016-11-24 MED ORDER — LIDOCAINE HCL (PF) 4 % IJ SOLN
INTRAMUSCULAR | Status: DC | PRN
Start: 2016-11-24 — End: 2016-11-24
  Administered 2016-11-24: 4 mL via RESPIRATORY_TRACT

## 2016-11-24 MED ORDER — DIPHENHYDRAMINE HCL 12.5 MG/5ML PO ELIX: 12.5000 mg | ORAL_SOLUTION | ORAL | Status: DC | PRN

## 2016-11-24 MED ORDER — PROPOFOL 10 MG/ML IV BOLUS
INTRAVENOUS | Status: AC
Start: 2016-11-24 — End: 2016-11-24
  Filled 2016-11-24: qty 20

## 2016-11-24 MED ORDER — HYDROMORPHONE HCL 1 MG/ML IJ SOLN
0.5000 mg | INTRAMUSCULAR | Status: DC | PRN
  Administered 2016-11-24 (×2): 0.5 mg via INTRAVENOUS

## 2016-11-24 MED ORDER — ROCURONIUM BROMIDE 50 MG/5ML IV SOSY
PREFILLED_SYRINGE | INTRAVENOUS | Status: AC
  Filled 2016-11-24: qty 5

## 2016-11-24 MED ORDER — EPHEDRINE SULFATE 50 MG/ML IJ SOLN
INTRAMUSCULAR | Status: DC | PRN
  Administered 2016-11-24 (×2): 10 mg via INTRAVENOUS

## 2016-11-24 MED ORDER — MIDAZOLAM HCL 2 MG/2ML IJ SOLN
INTRAMUSCULAR | Status: DC | PRN
Start: 2016-11-24 — End: 2016-11-24
  Administered 2016-11-24: 2 mg via INTRAVENOUS

## 2016-11-24 MED ORDER — METHOCARBAMOL 1000 MG/10ML IJ SOLN
500.0000 mg | Freq: Four times a day (QID) | INTRAVENOUS | Status: DC | PRN
  Filled 2016-11-24: qty 5

## 2016-11-24 MED ORDER — ZOLPIDEM TARTRATE 5 MG PO TABS: 5.0000 mg | ORAL_TABLET | Freq: Every evening | ORAL | Status: DC | PRN

## 2016-11-24 MED ORDER — ONDANSETRON HCL 4 MG/2ML IJ SOLN
INTRAMUSCULAR | Status: AC
  Filled 2016-11-24: qty 2

## 2016-11-24 MED ORDER — PHENOL 1.4 % MT LIQD
1.0000 | OROMUCOSAL | Status: DC | PRN
  Filled 2016-11-24: qty 177

## 2016-11-24 MED ORDER — ENOXAPARIN SODIUM 40 MG/0.4ML ~~LOC~~ SOLN
40.0000 mg | SUBCUTANEOUS | Status: DC
  Administered 2016-11-25 – 2016-11-26 (×2): 40 mg via SUBCUTANEOUS
  Filled 2016-11-24 (×2): qty 0.4

## 2016-11-24 MED ORDER — GLYCOPYRROLATE 0.2 MG/ML IJ SOLN
INTRAMUSCULAR | Status: DC | PRN
  Administered 2016-11-24: 0.2 mg via INTRAVENOUS

## 2016-11-24 MED ORDER — ONDANSETRON HCL 4 MG/2ML IJ SOLN: 4.0000 mg | Freq: Four times a day (QID) | INTRAMUSCULAR | Status: DC | PRN

## 2016-11-24 MED ORDER — ACETAMINOPHEN 10 MG/ML IV SOLN
INTRAVENOUS | Status: DC | PRN
  Administered 2016-11-24: 1000 mg via INTRAVENOUS

## 2016-11-24 MED ORDER — METOCLOPRAMIDE HCL 5 MG/ML IJ SOLN: 5.0000 mg | Freq: Three times a day (TID) | INTRAMUSCULAR | Status: DC | PRN

## 2016-11-24 MED ORDER — MAGNESIUM HYDROXIDE 400 MG/5ML PO SUSP
30.0000 mL | Freq: Every day | ORAL | Status: DC | PRN
  Administered 2016-11-26: 30 mL via ORAL
  Filled 2016-11-24: qty 30

## 2016-11-24 MED ORDER — DOCUSATE SODIUM 100 MG PO CAPS
100.0000 mg | ORAL_CAPSULE | Freq: Two times a day (BID) | ORAL | Status: DC
  Administered 2016-11-24 – 2016-11-26 (×4): 100 mg via ORAL
  Filled 2016-11-24 (×4): qty 1

## 2016-11-24 MED ORDER — FENTANYL CITRATE (PF) 250 MCG/5ML IJ SOLN
INTRAMUSCULAR | Status: AC
  Filled 2016-11-24: qty 5

## 2016-11-24 MED ORDER — ACETAMINOPHEN 325 MG PO TABS
650.0000 mg | ORAL_TABLET | Freq: Four times a day (QID) | ORAL | Status: DC | PRN
  Administered 2016-11-25 (×2): 650 mg via ORAL
  Filled 2016-11-24 (×2): qty 2

## 2016-11-24 MED ORDER — METHOCARBAMOL 500 MG PO TABS
500.0000 mg | ORAL_TABLET | Freq: Four times a day (QID) | ORAL | Status: DC | PRN
  Administered 2016-11-24 – 2016-11-25 (×4): 500 mg via ORAL
  Filled 2016-11-24 (×4): qty 1

## 2016-11-24 MED ORDER — LACTATED RINGERS IV SOLN
INTRAVENOUS | Status: DC
  Administered 2016-11-24 (×3): via INTRAVENOUS

## 2016-11-24 MED ORDER — ONDANSETRON HCL 4 MG/2ML IJ SOLN: 4.0000 mg | Freq: Once | INTRAMUSCULAR | Status: DC | PRN

## 2016-11-24 MED ORDER — FAMOTIDINE 20 MG PO TABS
ORAL_TABLET | ORAL | Status: AC
  Administered 2016-11-24: 20 mg via ORAL
  Filled 2016-11-24: qty 1

## 2016-11-24 MED ORDER — FENTANYL CITRATE (PF) 100 MCG/2ML IJ SOLN
INTRAMUSCULAR | Status: AC
  Administered 2016-11-24: 25 ug via INTRAVENOUS
  Filled 2016-11-24: qty 2

## 2016-11-24 MED ORDER — ACETAMINOPHEN 650 MG RE SUPP: 650.0000 mg | Freq: Four times a day (QID) | RECTAL | Status: DC | PRN

## 2016-11-24 MED ORDER — OXYCODONE HCL 5 MG PO TABS
5.0000 mg | ORAL_TABLET | ORAL | Status: DC | PRN
  Administered 2016-11-24 – 2016-11-25 (×6): 10 mg via ORAL
  Filled 2016-11-24 (×6): qty 2

## 2016-11-24 MED ORDER — PHENYLEPHRINE HCL 10 MG/ML IJ SOLN
INTRAMUSCULAR | Status: DC | PRN
  Administered 2016-11-24: 120 ug via INTRAVENOUS
  Administered 2016-11-24 (×2): 80 ug via INTRAVENOUS

## 2016-11-24 MED ORDER — MORPHINE SULFATE (PF) 2 MG/ML IV SOLN
2.0000 mg | INTRAVENOUS | Status: DC | PRN
  Administered 2016-11-24 – 2016-11-25 (×3): 2 mg via INTRAVENOUS
  Filled 2016-11-24 (×3): qty 1

## 2016-11-24 MED ORDER — MENTHOL 3 MG MT LOZG
1.0000 | LOZENGE | OROMUCOSAL | Status: DC | PRN
  Filled 2016-11-24: qty 9

## 2016-11-24 MED ORDER — LABETALOL HCL 5 MG/ML IV SOLN
10.0000 mg | INTRAVENOUS | Status: AC | PRN
  Administered 2016-11-24 (×2): 10 mg via INTRAVENOUS

## 2016-11-24 MED ORDER — GLYCOPYRROLATE 0.2 MG/ML IJ SOLN
INTRAMUSCULAR | Status: AC
  Filled 2016-11-24: qty 1

## 2016-11-24 MED ORDER — ACETAMINOPHEN 10 MG/ML IV SOLN
INTRAVENOUS | Status: AC
  Filled 2016-11-24: qty 100

## 2016-11-24 MED ORDER — SUGAMMADEX SODIUM 200 MG/2ML IV SOLN
INTRAVENOUS | Status: DC | PRN
  Administered 2016-11-24: 200 mg via INTRAVENOUS

## 2016-11-24 MED ORDER — SUGAMMADEX SODIUM 200 MG/2ML IV SOLN
INTRAVENOUS | Status: AC
  Filled 2016-11-24: qty 2

## 2016-11-24 MED ORDER — LABETALOL HCL 5 MG/ML IV SOLN
INTRAVENOUS | Status: AC
  Administered 2016-11-24: 10 mg via INTRAVENOUS
  Filled 2016-11-24: qty 4

## 2016-11-24 MED ORDER — CLINDAMYCIN PHOSPHATE 600 MG/50ML IV SOLN
600.0000 mg | Freq: Four times a day (QID) | INTRAVENOUS | Status: AC
  Administered 2016-11-24 – 2016-11-25 (×4): 600 mg via INTRAVENOUS
  Filled 2016-11-24 (×4): qty 50

## 2016-11-24 MED ORDER — PROPOFOL 10 MG/ML IV BOLUS
INTRAVENOUS | Status: DC | PRN
  Administered 2016-11-24: 200 mg via INTRAVENOUS

## 2016-11-24 MED ORDER — METOCLOPRAMIDE HCL 10 MG PO TABS: 5.0000 mg | ORAL_TABLET | Freq: Three times a day (TID) | ORAL | Status: DC | PRN

## 2016-11-24 MED ORDER — EPHEDRINE 5 MG/ML INJ
INTRAVENOUS | Status: AC
  Filled 2016-11-24: qty 10

## 2016-11-24 SURGICAL SUPPLY — 47 items
BLADE SAW SAG 18.5X105 (BLADE) ×3 IMPLANT
BNDG COHESIVE 6X5 TAN STRL LF (GAUZE/BANDAGES/DRESSINGS) ×6 IMPLANT
CANISTER SUCT 1200ML W/VALVE (MISCELLANEOUS) ×3 IMPLANT
CAPT HIP TOTAL 3 ×3 IMPLANT
CATH FOL LEG HOLDER (MISCELLANEOUS) ×3 IMPLANT
CATH TRAY METER 16FR LF (MISCELLANEOUS) ×3 IMPLANT
CHLORAPREP W/TINT 26ML (MISCELLANEOUS) ×3 IMPLANT
DRAPE C-ARM XRAY 36X54 (DRAPES) ×3 IMPLANT
DRAPE INCISE IOBAN 66X60 STRL (DRAPES) IMPLANT
DRAPE POUCH INSTRU U-SHP 10X18 (DRAPES) ×3 IMPLANT
DRAPE SHEET LG 3/4 BI-LAMINATE (DRAPES) ×9 IMPLANT
DRAPE TABLE BACK 80X90 (DRAPES) ×3 IMPLANT
DRSG OPSITE POSTOP 4X8 (GAUZE/BANDAGES/DRESSINGS) ×3 IMPLANT
ELECT BLADE 6.5 EXT (BLADE) ×3 IMPLANT
ELECT REM PT RETURN 9FT ADLT (ELECTROSURGICAL) ×3
ELECTRODE REM PT RTRN 9FT ADLT (ELECTROSURGICAL) ×1 IMPLANT
GLOVE BIO SURGEON STRL SZ7.5 (GLOVE) ×6 IMPLANT
GLOVE BIOGEL PI IND STRL 7.5 (GLOVE) ×2 IMPLANT
GLOVE BIOGEL PI IND STRL 9 (GLOVE) ×1 IMPLANT
GLOVE BIOGEL PI INDICATOR 7.5 (GLOVE) ×4
GLOVE BIOGEL PI INDICATOR 9 (GLOVE) ×2
GLOVE SURG SYN 9.0  PF PI (GLOVE) ×4
GLOVE SURG SYN 9.0 PF PI (GLOVE) ×2 IMPLANT
GOWN SRG 2XL LVL 4 RGLN SLV (GOWNS) ×2 IMPLANT
GOWN STRL NON-REIN 2XL LVL4 (GOWNS) ×4
GOWN STRL REUS W/ TWL LRG LVL3 (GOWN DISPOSABLE) ×1 IMPLANT
GOWN STRL REUS W/TWL LRG LVL3 (GOWN DISPOSABLE) ×2
HEMOVAC 400CC 10FR (MISCELLANEOUS) IMPLANT
HOOD PEEL AWAY FLYTE STAYCOOL (MISCELLANEOUS) ×3 IMPLANT
MAT BLUE FLOOR 46X72 FLO (MISCELLANEOUS) ×3 IMPLANT
NDL SAFETY 18GX1.5 (NEEDLE) ×3 IMPLANT
NEEDLE SPNL 18GX3.5 QUINCKE PK (NEEDLE) ×3 IMPLANT
NS IRRIG 1000ML POUR BTL (IV SOLUTION) ×3 IMPLANT
PACK HIP COMPR (MISCELLANEOUS) ×3 IMPLANT
SOL PREP PVP 2OZ (MISCELLANEOUS)
SOLUTION PREP PVP 2OZ (MISCELLANEOUS) IMPLANT
STAPLER SKIN PROX 35W (STAPLE) ×3 IMPLANT
STRAP SAFETY BODY (MISCELLANEOUS) ×3 IMPLANT
SUT DVC 2 QUILL PDO  T11 36X36 (SUTURE) ×2
SUT DVC 2 QUILL PDO T11 36X36 (SUTURE) ×1 IMPLANT
SUT SILK 0 (SUTURE) ×2
SUT SILK 0 30XBRD TIE 6 (SUTURE) ×1 IMPLANT
SUT V-LOC 90 ABS DVC 3-0 CL (SUTURE) ×3 IMPLANT
SUT VIC AB 1 CT1 36 (SUTURE) ×3 IMPLANT
SYR 20CC LL (SYRINGE) ×3 IMPLANT
SYR 30ML LL (SYRINGE) ×3 IMPLANT
TOWEL OR 17X26 4PK STRL BLUE (TOWEL DISPOSABLE) ×3 IMPLANT

## 2016-11-24 NOTE — Anesthesia Post-op Follow-up Note (Cosign Needed)
Anesthesia QCDR form completed.        

## 2016-11-24 NOTE — Anesthesia Procedure Notes (Signed)
Procedure Name: Intubation Date/Time: 11/24/2016 2:12 PM Performed by: Rosaria Ferries, Uel Davidow Pre-anesthesia Checklist: Patient identified, Emergency Drugs available, Suction available and Patient being monitored Patient Re-evaluated:Patient Re-evaluated prior to inductionOxygen Delivery Method: Circle system utilized Preoxygenation: Pre-oxygenation with 100% oxygen Intubation Type: IV induction Laryngoscope Size: Mac and 3 Grade View: Grade II Tube size: 7.0 mm Number of attempts: 2 Airway Equipment and Method: LTA kit utilized and Bougie stylet Placement Confirmation: positive ETCO2 and breath sounds checked- equal and bilateral Secured at: 22 cm Tube secured with: Tape Dental Injury: Teeth and Oropharynx as per pre-operative assessment  Difficulty Due To: Difficulty was unanticipated and Difficult Airway- due to anterior larynx Comments: Only tiny area of glottis visualized

## 2016-11-24 NOTE — Op Note (Signed)
11/24/2016  3:53 PM  PATIENT:  Angel Bautista  46 y.o. male  PRE-OPERATIVE DIAGNOSIS:  primary osteoarthritis  POST-OPERATIVE DIAGNOSIS:  primary osteoarthritis  PROCEDURE:  Procedure(s): TOTAL HIP ARTHROPLASTY ANTERIOR APPROACH (Right)  SURGEON: Leitha SchullerMichael J Ianmichael Amescua, MD  ASSISTANTS: None  ANESTHESIA:   general  EBL:  Total I/O In: 1600 [I.V.:1600] Out: 350 [Urine:100; Blood:250]  BLOOD ADMINISTERED:none  DRAINS: none   LOCAL MEDICATIONS USED:  MARCAINE     SPECIMEN:  Source of Specimen:  Right femoral head  DISPOSITION OF SPECIMEN:  PATHOLOGY  COUNTS:  YES  TOURNIQUET:  * No tourniquets in log *  IMPLANTS: Medacta AMIS 3 lateralized stem with 56 Mpact DM cup, S 28 mm head and liner  DICTATION: .Dragon Dictation   The patient was brought to the operating room and after spinal anesthesia was obtained patient was placed on the operative table with the ipsilateral foot into the Medacta attachment, contralateral leg on a well-padded table. C-arm was brought in and preop template x-ray taken. After prepping and draping in usual sterile fashion appropriate patient identification and timeout procedures were completed. Anterior approach to the hip was obtained and centered over the greater trochanter and TFL muscle. The subcutaneous tissue was incised hemostasis being achieved by electrocautery. TFL fascia was incised and the muscle retracted laterally deep retractor placed. The lateral femoral circumflex vessels were identified and ligated. The anterior capsule was exposed and a capsulotomy performed. The neck was identified and a femoral neck cut carried out with a saw. The head was removed without difficulty and showed sclerotic femoral head and acetabulum. Reaming was carried out to 54 mm and a 56 mm cup trial gave appropriate tightness to the acetabular component a Mpact DM cup was impacted into position. The leg was then externally rotated and ischiofemoral and patellofemoral releases  carried out. The femur was sequentially broached to a size 3, size 3 lateralized stem and S had trials were placed and the final components chosen. The 3 lateralized stem was inserted along with a S 28 mm head and 56 mm liner. The hip was reduced and was stable the wound was thoroughly irrigated . The deep fascia was closed using a heavy Quill after infiltration of 30 cc of quarter percent Sensorcaine with epinephrine. 3-0 V-loc subcuticular, close the skin with skin staples. Xeroform and honeycomb dressing then applied PLAN OF CARE: Admit to inpatient

## 2016-11-24 NOTE — H&P (Signed)
Reviewed paper H+P, will be scanned into chart. No changes noted.  

## 2016-11-24 NOTE — Transfer of Care (Signed)
Immediate Anesthesia Transfer of Care Note  Patient: Angel Bautista  Procedure(s) Performed: Procedure(s): TOTAL HIP ARTHROPLASTY ANTERIOR APPROACH (Right)  Patient Location: PACU  Anesthesia Type:General  Level of Consciousness: sedated  Airway & Oxygen Therapy: Patient Spontanous Breathing and Patient connected to face mask oxygen  Post-op Assessment: Report given to RN and Post -op Vital signs reviewed and stable  Post vital signs: Reviewed  Last Vitals:  Vitals:   11/24/16 1133 11/24/16 1555  BP: (!) 192/94 137/78  Pulse: 81 90  Resp: 16 10  Temp: (!) 35.8 C     Last Pain:  Vitals:   11/24/16 1133  TempSrc: Tympanic  PainSc: 6          Complications: No apparent anesthesia complications

## 2016-11-24 NOTE — Anesthesia Preprocedure Evaluation (Signed)
Anesthesia Evaluation  Patient identified by MRN, date of birth, ID band Patient awake    Reviewed: Allergy & Precautions, H&P , NPO status , Patient's Chart, lab work & pertinent test results, reviewed documented beta blocker date and time   Airway Mallampati: II  TM Distance: >3 FB Neck ROM: full    Dental  (+) Teeth Intact   Pulmonary neg pulmonary ROS, Current Smoker,    Pulmonary exam normal        Cardiovascular hypertension, negative cardio ROS Normal cardiovascular exam Rhythm:regular Rate:Normal     Neuro/Psych negative neurological ROS  negative psych ROS   GI/Hepatic negative GI ROS, Neg liver ROS, GERD  Medicated,  Endo/Other  negative endocrine ROS  Renal/GU negative Renal ROS  negative genitourinary   Musculoskeletal   Abdominal   Peds  Hematology negative hematology ROS (+)   Anesthesia Other Findings Past Medical History: No date: Arthritis No date: Hypertension No date: Palpitations Past Surgical History: No date: JOINT REPLACEMENT 05/14/2015: TOTAL HIP ARTHROPLASTY Left     Comment: Procedure: TOTAL HIP ARTHROPLASTY ANTERIOR               APPROACH;  Surgeon: Kennedy BuckerMichael Menz, MD;                Location: [facility] ORS;  Service: Orthopedics;                Laterality: Left; BMI    Body Mass Index:  27.75 kg/m     Reproductive/Obstetrics negative OB ROS                             Anesthesia Physical Anesthesia Plan  ASA: II  Anesthesia Plan: General ETT   Post-op Pain Management:    Induction:   Airway Management Planned:   Additional Equipment:   Intra-op Plan:   Post-operative Plan:   Informed Consent: I have reviewed the patients History and Physical, chart, labs and discussed the procedure including the risks, benefits and alternatives for the proposed anesthesia with the patient or authorized representative who has indicated his/her understanding and  acceptance.   Dental Advisory Given  Plan Discussed with: CRNA  Anesthesia Plan Comments:         Anesthesia Quick Evaluation

## 2016-11-25 ENCOUNTER — Encounter: Payer: Self-pay | Admitting: Orthopedic Surgery

## 2016-11-25 LAB — CBC
HCT: 37.5 % — ABNORMAL LOW (ref 40.0–52.0)
HEMOGLOBIN: 13.3 g/dL (ref 13.0–18.0)
MCH: 32.3 pg (ref 26.0–34.0)
MCHC: 35.4 g/dL (ref 32.0–36.0)
MCV: 91.1 fL (ref 80.0–100.0)
Platelets: 133 10*3/uL — ABNORMAL LOW (ref 150–440)
RBC: 4.11 MIL/uL — ABNORMAL LOW (ref 4.40–5.90)
RDW: 13.1 % (ref 11.5–14.5)
WBC: 6.1 10*3/uL (ref 3.8–10.6)

## 2016-11-25 LAB — BASIC METABOLIC PANEL
Anion gap: 6 (ref 5–15)
BUN: 17 mg/dL (ref 6–20)
CALCIUM: 8.2 mg/dL — AB (ref 8.9–10.3)
CO2: 28 mmol/L (ref 22–32)
CREATININE: 1.25 mg/dL — AB (ref 0.61–1.24)
Chloride: 101 mmol/L (ref 101–111)
GFR calc non Af Amer: >60 mL/min (ref >60)
Glucose, Bld: 98 mg/dL (ref 65–99)
Potassium: 4 mmol/L (ref 3.5–5.1)
SODIUM: 135 mmol/L (ref 135–145)

## 2016-11-25 MED ORDER — ENOXAPARIN SODIUM 40 MG/0.4ML ~~LOC~~ SOLN: 40.0000 mg | SUBCUTANEOUS | 0 refills | Status: DC

## 2016-11-25 MED ORDER — HYDROCODONE-ACETAMINOPHEN 7.5-325 MG PO TABS
1.0000 | ORAL_TABLET | ORAL | Status: DC | PRN
  Administered 2016-11-25 – 2016-11-26 (×8): 2 via ORAL
  Filled 2016-11-25 (×8): qty 2

## 2016-11-25 MED ORDER — HYDRALAZINE HCL 10 MG PO TABS
10.0000 mg | ORAL_TABLET | ORAL | Status: DC | PRN
  Administered 2016-11-25 (×2): 10 mg via ORAL
  Filled 2016-11-25 (×3): qty 1

## 2016-11-25 MED ORDER — OXYCODONE HCL 5 MG PO TABS: 5.0000 mg | ORAL_TABLET | ORAL | 0 refills | Status: DC | PRN

## 2016-11-25 NOTE — Progress Notes (Signed)
Patient's pain still not under control with the oral oxy and Robaxin. Notified MD. MD to review meds.

## 2016-11-25 NOTE — Care Management (Signed)
[facility]

## 2016-11-25 NOTE — Discharge Instructions (Signed)
ANTERIOR APPROACH TOTAL HIP REPLACEMENT POSTOPERATIVE DIRECTIONS ° ° °Hip Rehabilitation, Guidelines Following Surgery  °The results of a hip operation are greatly improved after range of motion and muscle strengthening exercises. Follow all safety measures which are given to protect your hip. If any of these exercises cause increased pain or swelling in your joint, decrease the amount until you are comfortable again. Then slowly increase the exercises. Call your caregiver if you have problems or questions.  ° °HOME CARE INSTRUCTIONS  °Remove items at home which could result in a fall. This includes throw rugs or furniture in walking pathways.  °· ICE to the affected hip every three hours for 30 minutes at a time and then as needed for pain and swelling.  Continue to use ice on the hip for pain and swelling from surgery. You may notice swelling that will progress down to the foot and ankle.  This is normal after surgery.  Elevate the leg when you are not up walking on it.   °· Continue to use the breathing machine which will help keep your temperature down.  It is common for your temperature to cycle up and down following surgery, especially at night when you are not up moving around and exerting yourself.  The breathing machine keeps your lungs expanded and your temperature down. °· Do not place pillow under knee, focus on keeping the knee straight while resting ° °DIET °You may resume your previous home diet once your are discharged from the hospital. ° °DRESSING / WOUND CARE / SHOWERING °Keep the surgical dressing until follow up.  The dressing is water proof, so you can shower without any extra covering.  IF THE DRESSING FALLS OFF or the wound gets wet inside, change the dressing with sterile gauze.  Please use good hand washing techniques before changing the dressing.  Do not use any lotions or creams on the incision until instructed by your surgeon.   °Keep your dressing dry with showering.  You can keep it  covered and pat dry. °Change the surgical dressing daily and reapply a dry dressing each time. ° °ACTIVITY °Walk with your walker as instructed. °Use walker as long as suggested by your caregivers. °Avoid periods of inactivity such as sitting longer than an hour when not asleep. This helps prevent blood clots.  °You may resume a sexual relationship in one month or when given the OK by your doctor.  °You may return to work once you are cleared by your doctor.  °Do not drive a car for 6 weeks or until released by you surgeon.  °Do not drive while taking narcotics. ° °WEIGHT BEARING °Weight bearing as tolerated with assist device (walker, cane, etc) as directed, use it as long as suggested by your surgeon or therapist, typically at least 4-6 weeks. ° °POSTOPERATIVE CONSTIPATION PROTOCOL °Constipation - defined medically as fewer than three stools per week and severe constipation as less than one stool per week. ° °One of the most common issues patients have following surgery is constipation.  Even if you have a regular bowel pattern at home, your normal regimen is likely to be disrupted due to multiple reasons following surgery.  Combination of anesthesia, postoperative narcotics, change in appetite and fluid intake all can affect your bowels.  In order to avoid complications following surgery, here are some recommendations in order to help you during your recovery period. ° °Colace (docusate) - Pick up an over-the-counter form of Colace or another stool softener and take twice   a day as long as you are requiring postoperative pain medications.  Take with a full glass of water daily.  If you experience loose stools or diarrhea, hold the colace until you stool forms back up.  If your symptoms do not get better within 1 week or if they get worse, check with your doctor. ° °Dulcolax (bisacodyl) - Pick up over-the-counter and take as directed by the product packaging as needed to assist with the movement of your bowels.   Take with a full glass of water.  Use this product as needed if not relieved by Colace only.  ° °MiraLax (polyethylene glycol) - Pick up over-the-counter to have on hand.  MiraLax is a solution that will increase the amount of water in your bowels to assist with bowel movements.  Take as directed and can mix with a glass of water, juice, soda, coffee, or tea.  Take if you go more than two days without a movement. °Do not use MiraLax more than once per day. Call your doctor if you are still constipated or irregular after using this medication for 7 days in a row. ° °If you continue to have problems with postoperative constipation, please contact the office for further assistance and recommendations.  If you experience "the worst abdominal pain ever" or develop nausea or vomiting, please contact the office immediatly for further recommendations for treatment. ° °ITCHING ° If you experience itching with your medications, try taking only a single pain pill, or even half a pain pill at a time.  You can also use Benadryl over the counter for itching or also to help with sleep.  ° °TED HOSE STOCKINGS °Wear the elastic stockings on both legs for three weeks following surgery during the day but you may remove then at night for sleeping. ° °MEDICATIONS °See your medication summary on the “After Visit Summary” that the nursing staff will review with you prior to discharge.  You may have some home medications which will be placed on hold until you complete the course of blood thinner medication.  It is important for you to complete the blood thinner medication as prescribed by your surgeon.  Continue your approved medications as instructed at time of discharge. ° °PRECAUTIONS °If you experience chest pain or shortness of breath - call 911 immediately for transfer to the hospital emergency department.  °If you develop a fever greater that 101 F, purulent drainage from wound, increased redness or drainage from wound, foul odor  from the wound/dressing, or calf pain - CONTACT YOUR SURGEON.   °                                                °FOLLOW-UP APPOINTMENTS °Make sure you keep all of your appointments after your operation with your surgeon and caregivers. You should call the office at the above phone number and make an appointment for approximately two weeks after the date of your surgery or on the date instructed by your surgeon outlined in the "After Visit Summary". ° °RANGE OF MOTION AND STRENGTHENING EXERCISES  °These exercises are designed to help you keep full movement of your hip joint. Follow your caregiver's or physical therapist's instructions. Perform all exercises about fifteen times, three times per day or as directed. Exercise both hips, even if you have had only one joint replacement. These exercises can be   done on a training (exercise) mat, on the floor, on a table or on a bed. Use whatever works the best and is most comfortable for you. Use music or television while you are exercising so that the exercises are a pleasant break in your day. This will make your life better with the exercises acting as a break in routine you can look forward to.  °Lying on your back, slowly slide your foot toward your buttocks, raising your knee up off the floor. Then slowly slide your foot back down until your leg is straight again.  °Lying on your back spread your legs as far apart as you can without causing discomfort.  °Lying on your side, raise your upper leg and foot straight up from the floor as far as is comfortable. Slowly lower the leg and repeat.  °Lying on your back, tighten up the muscle in the front of your thigh (quadriceps muscles). You can do this by keeping your leg straight and trying to raise your heel off the floor. This helps strengthen the largest muscle supporting your knee.  °Lying on your back, tighten up the muscles of your buttocks both with the legs straight and with the knee bent at a comfortable angle while  keeping your heel on the floor.  ° °IF YOU ARE TRANSFERRED TO A SKILLED REHAB FACILITY °If the patient is transferred to a skilled rehab facility following release from the hospital, a list of the current medications will be sent to the facility for the patient to continue.  When discharged from the skilled rehab facility, please have the facility set up the patient's Home Health Physical Therapy prior to being released. Also, the skilled facility will be responsible for providing the patient with their medications at time of release from the facility to include their pain medication, the muscle relaxants, and their blood thinner medication. If the patient is still at the rehab facility at time of the two week follow up appointment, the skilled rehab facility will also need to assist the patient in arranging follow up appointment in our office and any transportation needs. ° °MAKE SURE YOU:  °Understand these instructions.  °Get help right away if you are not doing well or get worse.  ° ° °Pick up stool softner and laxative for home use following surgery while on pain medications. °Do not submerge incision under water. °Please use good hand washing techniques while changing dressing each day. °May shower starting three days after surgery. °Please use a clean towel to pat the incision dry following showers. °Continue to use ice for pain and swelling after surgery. °Do not use any lotions or creams on the incision until instructed by your surgeon. ° °

## 2016-11-25 NOTE — Progress Notes (Signed)
Physical Therapy Treatment Patient Details Name: Angel Bautista MRN: [id number] DOB: 21-Dec-1970 Today's Date: 11/25/2016    History of Present Illness Pt underwent R THR anterior approach without reported post-op complications. PT evaluation performed at POD#1.     PT Comments    Pt demonstrates good progress with therapist during PM session. He is able to progress his ambulation distance as well as perform seated exercises at EOB. He continues to demonstrate decreased weight acceptance to RLE during ambulation with heavy UE reliance. He also continues to demonstrate R toe internal rotation during gait. Pt again asked about this and encouraged him to speak with orthopedic surgeon. Pt will benefit from skilled PT services to address deficits in strength, balance, and mobility in order to return to full function at home.   Follow Up Recommendations  Home health PT;Outpatient PT (Per patient and orthopedist preference)     Equipment Recommendations  Rolling walker with 5" wheels;Other (comment) (Pt reports he doesn't have walker from last surgery)    Recommendations for Other Services       Precautions / Restrictions Precautions Precautions: Anterior Hip Precaution Booklet Issued: Yes (comment) Precaution Comments: pt reports having L hip replacement last year with anterior approach  Restrictions Weight Bearing Restrictions: Yes RLE Weight Bearing: Weight bearing as tolerated    Mobility  Bed Mobility Overal bed mobility: Needs Assistance Bed Mobility: Supine to Sit;Sit to Supine     Supine to sit: Supervision Sit to supine: Supervision   General bed mobility comments: Very minimal use of bed rails and HOB flat this afternoon. Good speed/sequencing noted  Transfers Overall transfer level: Needs assistance Equipment used: Rolling walker (2 wheeled) Transfers: Sit to/from Stand Sit to Stand: Supervision         General transfer comment: Pt demonstrates improving  speed/sequencing. Safe hand placement demonstrated. Continues to demonstrate decreased weight shifting to RLE  Ambulation/Gait Ambulation/Gait assistance: Min guard Ambulation Distance (Feet): 90 Feet Assistive device: Rolling walker (2 wheeled) Gait Pattern/deviations: Step-to pattern Gait velocity: Decreased Gait velocity interpretation: <1.8 ft/sec, indicative of risk for recurrent falls General Gait Details: Pt able to increase ambulation distance this afternoon. Pt able to progress to partial step-through pattern with decreased weight shifting to RLE. Heavy UE reliance due to R hip pain when shifting weight onto R side. Denies DOE.    Stairs            Wheelchair Mobility    Modified Rankin (Stroke Patients Only)       Balance Overall balance assessment: Needs assistance Sitting-balance support: No upper extremity supported Sitting balance-Leahy Scale: Normal     Standing balance support: No upper extremity supported Standing balance-Leahy Scale: Fair Standing balance comment: Able to maintain static standing balance without UE support and decreased weight shifting to RLE                    Cognition Arousal/Alertness: Awake/alert Behavior During Therapy: WFL for tasks assessed/performed Overall Cognitive Status: Within Functional Limits for tasks assessed                      Exercises Total Joint Exercises Ankle Circles/Pumps: Strengthening;Both;10 reps;Supine Quad Sets: Strengthening;Both;10 reps;Supine Gluteal Sets: Strengthening;Both;10 reps;Supine Hip ABduction/ADduction: Strengthening;10 reps;Seated;Both Long Arc Quad: Strengthening;10 reps;Seated;Right Knee Flexion: Strengthening;Right;10 reps;Seated Marching in Standing: Strengthening;Both;10 reps;Seated Other Exercises Other Exercises: pt educated in cognitive behavioral strategies to support pain mgt including progressive relaxation, pursed lip breathing, and pleasant imagery     General  Comments        Pertinent Vitals/Pain Pain Assessment: 0-10 Pain Score: 4  Pain Location: R hip Pain Descriptors / Indicators: Sore Pain Intervention(s): Premedicated before session;Monitored during session    Home Living Family/patient expects to be discharged to:: Private residence Living Arrangements: Parent;Alone Available Help at Discharge: Family;Available 24 hours/day Type of Home: House Home Access: Stairs to enter Entrance Stairs-Rails: Can reach both;Left;Right Home Layout: One level Home Equipment: Cane - single point;Grab bars - tub/shower      Prior Function Level of Independence: Independent      Comments: Works at [employer] center and [employer]   PT Goals (current goals can now be found in the care plan section) Acute Rehab PT Goals Patient Stated Goal: Return to prior function at home PT Goal Formulation: With patient Time For Goal Achievement: 12/09/16 Potential to Achieve Goals: Good Progress towards PT goals: Progressing toward goals    Frequency    BID      PT Plan Current plan remains appropriate    Co-evaluation             End of Session Equipment Utilized During Treatment: Gait belt Activity Tolerance: Patient tolerated treatment well Patient left: with call bell/phone within reach;in bed;with nursing/sitter in room;Other (comment) (CNA to reconnect AV1 and turn on alarm)     Time: [id number] PT Time Calculation (min) (ACUTE ONLY): 18 min  Charges:  $Therapeutic Exercise: 8-22 mins                    G Codes:      Sharalyn InkJason D Yuliana Vandrunen PT, DPT   Eman Rynders 11/25/2016, 3:44 PM

## 2016-11-25 NOTE — Progress Notes (Signed)
Pt BP at  0304 176/87. MD made aware. New orders received. Will monitor.

## 2016-11-25 NOTE — Evaluation (Signed)
Occupational Therapy Evaluation Patient Details Name: Angel Bautista MRN: [id number] DOB: 16-May-1971 Today's Date: 11/25/2016    History of Present Illness Pt underwent R THR anterior approach without reported post-op complications. PT evaluation performed at POD#1.    Clinical Impression   Pt seen for OT evaluation this date. Pt reporting "unbearable" pain with movement, declined out of bed activity during OT session due to pain. Pt received pain meds earlier and reported 6/10 pain at rest. Pt reports having same procedure/approach done on L hip and able to recall all anterior hip precautions. Pt educated in use of AE for LB ADL but declined out of bed due to pain this date. Pt would benefit from skilled OT services to address pain, decr activity tolerance, and decr knowledge/use of AE for ADL to maximize functional independence and return to PLOF while minimizing risk of falls.     Follow Up Recommendations  No OT follow up    Equipment Recommendations  Tub/shower seat    Recommendations for Other Services       Precautions / Restrictions Precautions Precautions: Anterior Hip Precaution Booklet Issued: Yes (comment) Precaution Comments: pt reports having L hip replacement last year with anterior approach  Restrictions Weight Bearing Restrictions: Yes RLE Weight Bearing: Weight bearing as tolerated      Mobility Bed Mobility Overal bed mobility: Needs Assistance Bed Mobility: Supine to Sit     Supine to sit: Min guard     General bed mobility comments: pt declined bed mobility due to pain, did agree to [facility] during session  Transfers         General transfer comment: pt declined out of bed due to pain    Balance                                  ADL Overall ADL's : Needs assistance/impaired                 Upper Body Dressing : Sitting;Set up   Lower Body Dressing: Minimal assistance;Adhering to hip precautions;Sitting/lateral  leans;Sit to/from stand;With adaptive equipment Lower Body Dressing Details (indicate cue type and reason): pt educated in use of AE for LB ADL, pt verbalized understanding, able to teach back verbally but declined to trial this date due to pain   Toilet Transfer Details (indicate cue type and reason): pt declined out of bed due to pain            General ADL Comments: Pt limited by pain this session     Vision Vision Assessment?: No apparent visual deficits   Perception     Praxis      Pertinent Vitals/Pain Pain Assessment: 0-10 Pain Score: 6  (6/10 R hip at rest, "unbearable" with movement) Pain Location: R hip Pain Descriptors / Indicators: Grimacing Pain Intervention(s): Limited activity within patient's tolerance;Monitored during session;Repositioned;Utilized relaxation techniques     Hand Dominance Right   Extremity/Trunk Assessment Upper Extremity Assessment Upper Extremity Assessment: Overall WFL for tasks assessed   Lower Extremity Assessment Lower Extremity Assessment: Defer to PT evaluation        Communication Communication Communication: No difficulties   Cognition Arousal/Alertness: Awake/alert Behavior During Therapy: WFL for tasks assessed/performed Overall Cognitive Status: Within Functional Limits for tasks assessed                     General Comments  Exercises  Other Exercises Other Exercises: pt educated in cognitive behavioral strategies to support pain mgt including progressive relaxation, pursed lip breathing, and pleasant imagery   Shoulder Instructions      Home Living Family/patient expects to be discharged to:: Private residence Living Arrangements: Parent;Alone Available Help at Discharge: Family;Available 24 hours/day Type of Home: House Home Access: Stairs to enter [employer] of Steps: 4 Entrance Stairs-Rails: Can reach both;Left;Right Home Layout: One level     Bathroom Shower/Tub: Tub/shower  unit Shower/tub characteristics: Engineer, building services: Standard     Home Equipment: Medical laboratory scientific officer - single point;Grab bars - tub/shower          Prior Functioning/Environment Level of Independence: Independent        Comments: 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 [employer]        OT Problem List: Pain;Decreased activity tolerance;Decreased knowledge of use of DME or AE   OT Treatment/Interventions: Self-care/ADL training;Energy conservation;DME and/or AE instruction;Patient/family education    OT Goals(Current goals can be found in the care plan section) Acute Rehab OT Goals Patient Stated Goal: Return to prior function at home OT Goal Formulation: With patient Time For Goal Achievement: 12/09/16 Potential to Achieve Goals: Good  OT Frequency: Min 1X/week   Barriers to D/C:            Co-evaluation              End of Session    Activity Tolerance: Patient limited by pain Patient left: in bed;with call bell/phone within reach;with bed alarm set   Time: 1100-1123 OT Time Calculation (min): 23 min Charges:  OT General Charges $OT Visit: 1 Procedure OT Evaluation $OT Eval Low Complexity: 1 Procedure OT Treatments $Self Care/Home Management : 8-22 mins G-Codes:    Eliezer Bottom, OTR/L 11/25/2016, 12:03 PM

## 2016-11-25 NOTE — Evaluation (Signed)
Physical Therapy Evaluation Patient Details Name: Angel Bautista MRN: [id number] DOB: [birth year] Today's Date: 11/25/2016   History of Present Illness  Pt underwent R THR anterior approach without reported post-op complications. PT evaluation performed at POD#1.   Clinical Impression  Pt admitted with above diagnosis. Pt currently with functional limitations due to the deficits listed below (see PT Problem List).  Pt does well with PT evaluation however he does report progressively increasing pain by the end of session. RN notified that pt requesting pain medications however he is not allowed to have more for an additional 20 minutes after evaluation completed. He does have considerable R hip flexion and abduction weakness as witnessed during bed exercises. He is safe and steady with transfers and ambulation however does rely on heavy UE support. Will continue to progress exercise intensity and ambulation distance at next session. Pt will benefit from skilled PT services to address deficits in strength, balance, and mobility in order to return to full function at home.     Follow Up Recommendations Home health PT;Outpatient PT (Per patient and orthopedist preference)    Equipment Recommendations  Rolling walker with 5" wheels;Other (comment) (Pt reports he doesn't have walker from last surgery)    Recommendations for Other Services       Precautions / Restrictions Precautions Precautions: Anterior Hip Precaution Booklet Issued: Yes (comment) Restrictions Weight Bearing Restrictions: Yes RLE Weight Bearing: Weight bearing as tolerated      Mobility  Bed Mobility Overal bed mobility: Needs Assistance Bed Mobility: Supine to Sit     Supine to sit: Min guard     General bed mobility comments: HOB elevated, bed rails utilized, increase time required. Pt educated how to utilize LLE to assist RLE when exiting bed to the right  Transfers Overall transfer level: Needs  assistance Equipment used: Rolling walker (2 wheeled) Transfers: Sit to/from Stand Sit to Stand: Min guard         General transfer comment: Pt demonstrates decreased weight shifting to RLE during transfer. Safe hand placement and fair speed. Pt reports increased pain in R hip during transfers  Ambulation/Gait Ambulation/Gait assistance: Min guard Ambulation Distance (Feet): 50 Feet Assistive device: Rolling walker (2 wheeled) Gait Pattern/deviations: Step-to pattern Gait velocity: Decreased Gait velocity interpretation: <1.8 ft/sec, indicative of risk for recurrent falls General Gait Details: Pt educated about proper sequencing with rolling walker. He reports increase in pain during ambulation. Pt with mild toe in on RLE which he is concerned about. Encouraged him to speak with orthopedist. Pt with decreased L step length and decreased weight acceptance into RLE. Increased UE support  Stairs            Wheelchair Mobility    Modified Rankin (Stroke Patients Only)       Balance Overall balance assessment: Needs assistance Sitting-balance support: No upper extremity supported Sitting balance-Leahy Scale: Normal     Standing balance support: No upper extremity supported Standing balance-Leahy Scale: Fair Standing balance comment: Able to maintain static standing balance without UE support and decreased weight shifting to RLE                             Pertinent Vitals/Pain Pain Assessment: 0-10 Pain Score: 5  Pain Location: R hip Pain Descriptors / Indicators: Dull Pain Intervention(s): Monitored during session;Premedicated before session;Limited activity within patient's tolerance    Home Living Family/patient expects to be discharged to:: Private residence Living Arrangements: Parent;Alone  Available Help at Discharge: Family Type of Home: House Home Access: Stairs to enter Entrance Stairs-Rails: Can reach both;Left;Right Entrance Stairs-Number of  Steps: 4 Home Layout: One level Home Equipment: Cane - single point;Grab bars - tub/shower (no walker, no w/c)      Prior Function Level of Independence: Independent         Comments: Works at [employer] and [employer]     Hand Dominance   Dominant Hand: Right    Extremity/Trunk Assessment   Upper Extremity Assessment Upper Extremity Assessment: Overall WFL for tasks assessed    Lower Extremity Assessment Lower Extremity Assessment: RLE deficits/detail RLE Deficits / Details: Pt requires assist for SAQ and SLR. Full DF/PF with intact sensation. Pt reports some minimal numbness on proximal anterior thigh near incision.       Communication   Communication: No difficulties  Cognition Arousal/Alertness: Awake/alert Behavior During Therapy: WFL for tasks assessed/performed Overall Cognitive Status: Within Functional Limits for tasks assessed                      General Comments      Exercises Total Joint Exercises Ankle Circles/Pumps: Strengthening;Both;10 reps;Supine Quad Sets: Strengthening;Both;10 reps;Supine Gluteal Sets: Strengthening;Both;10 reps;Supine Towel Squeeze: Strengthening;Both;10 reps;Supine Short Arc Quad: Strengthening;10 reps;Supine;Right Heel Slides: Strengthening;Right;10 reps;Supine Hip ABduction/ADduction: Strengthening;Right;10 reps;Supine Straight Leg Raises: Strengthening;Right;10 reps;Supine   Assessment/Plan    PT Assessment Patient needs continued PT services  PT Problem List Decreased strength;Decreased range of motion;Decreased activity tolerance;Decreased balance;Decreased mobility;Pain          PT Treatment Interventions DME instruction;Gait training;Stair training;Functional mobility training;Therapeutic activities;Therapeutic exercise;Balance training;Neuromuscular re-education;Patient/family education;Manual techniques    PT Goals (Current goals can be found in the Care Plan section)  Acute Rehab PT  Goals Patient Stated Goal: Return to prior function at home PT Goal Formulation: With patient Time For Goal Achievement: 12/09/16 Potential to Achieve Goals: Good    Frequency BID   Barriers to discharge   None    Co-evaluation               End of Session Equipment Utilized During Treatment: Gait belt Activity Tolerance: Patient tolerated treatment well Patient left: with call bell/phone within reach;in chair;with chair alarm set;with SCD's reapplied Nurse Communication: Patient requests pain meds         Time: 0820-0845 PT Time Calculation (min) (ACUTE ONLY): 25 min   Charges:   PT Evaluation $PT Eval Low Complexity: 1 Procedure PT Treatments $Therapeutic Exercise: 8-22 mins   PT G Codes:       Sharalyn InkJason D Huprich PT, DPT   Huprich,Jason 11/25/2016, 9:35 AM

## 2016-11-25 NOTE — Care Management Note (Addendum)
Case Management Note  Patient Details  Name: Angel Bautista MRN: [id number] Date of Birth: July 15, 1971  Subjective/Objective:                  Met with patient to discuss discharge planning. He plans to go to outpatient PT at [facility] which has been requested by this RNCM with Dr. Rudene Christians. He is not certain that patient will need PT. Patient has agreed to a walker but said "they told me I needed one last time but I just used my cane". He hopes that he will not need Lovenox at discharge- I have sent message to Dr. Rudene Christians also with this request. He states that he usually uses [facility] on [address] [phone] but has not used them in a while for medications.    Action/Plan: [facility]. RNCM will continue to follow with Dr. Rudene Christians for a discharge plan.   Expected Discharge Date:                  Expected Discharge Plan:     In-House Referral:     Discharge planning Services  CM Consult  Post Acute Care Choice:  Durable Medical Equipment Choice offered to:  Patient  DME Arranged:  Walker rolling DME Agency:  [place]:    [facility] Agency:     Status of Service:  In process, will continue to follow  If discussed at Long Length of Stay Meetings, dates discussed:    Additional Comments:  Marshell Garfinkel, RN 11/25/2016, 10:15 AM

## 2016-11-25 NOTE — Progress Notes (Signed)
Clinical Child psychotherapistocial Worker (CSW) received SNF consult. PT is recommending home health vs. Outpatient PT. RN case manager aware of above. Please reconsult if social work needs arise. CSW signing off.   Baker Hughes IncorporatedBailey Margarito Dehaas, LCSW [phone]

## 2016-11-25 NOTE — Progress Notes (Signed)
  Subjective: 1 Day Post-Op Procedure(s) (LRB): TOTAL HIP ARTHROPLASTY ANTERIOR APPROACH (Right) Patient reports pain as moderate.   Patient seen in rounds with Dr. Rosita KeaMenz. Patient is well, and has had no acute complaints or problems Plan is to go Home after hospital stay. Negative for chest pain and shortness of breath Fever: no Gastrointestinal: Negative for nausea and vomiting  Objective: Vital signs in last 24 hours: Temp:  [96.5 F (35.8 C)-98.6 F (37 C)] 97.8 F (36.6 C) (01/24 0304) Pulse Rate:  [66-111] 74 (01/24 0304) Resp:  [0-20] 18 (01/24 0304) BP: (136-192)/(78-113) 176/87 (01/24 0304) SpO2:  [92 %-99 %] 98 % (01/24 0304) Weight:  [90.3 kg (199 lb)] 90.3 kg (199 lb) (01/23 1133)  Intake/Output from previous day:  Intake/Output Summary (Last 24 hours) at 11/25/16 16100712 Last data filed at 11/25/16 0200  Gross per 24 hour  Intake             1840 ml  Output              750 ml  Net             1090 ml    Intake/Output this shift: No intake/output data recorded.  Labs:  Recent Labs  11/24/16 1806 11/25/16 0507  HGB 14.4 13.3    Recent Labs  11/24/16 1806 11/25/16 0507  WBC 9.2 6.1  RBC 4.51 4.11*  HCT 40.8 37.5*  PLT 151 133*    Recent Labs  11/24/16 1806 11/25/16 0507  NA  --  135  K  --  4.0  CL  --  101  CO2  --  28  BUN  --  17  CREATININE 1.42* 1.25*  GLUCOSE  --  98  CALCIUM  --  8.2*   No results for input(s): LABPT, INR in the last 72 hours.   EXAM General - Patient is Alert and Oriented Extremity - Sensation intact distally Intact pulses distally Dorsiflexion/Plantar flexion intact No cellulitis present Compartment soft Dressing/Incision - clean, dry, blood tinged drainage Motor Function - intact, moving foot and toes well on exam.   Past Medical History:  Diagnosis Date  . Arthritis   . Hypertension   . Palpitations     Assessment/Plan: 1 Day Post-Op Procedure(s) (LRB): TOTAL HIP ARTHROPLASTY ANTERIOR APPROACH  (Right) Active Problems:   Primary localized osteoarthritis of right hip  Estimated body mass index is 27.75 kg/m as calculated from the following:   Height as of this encounter: 5\' 11"  (1.803 m).   Weight as of this encounter: 90.3 kg (199 lb). Advance diet Up with therapy D/C IV fluids Plan for discharge tomorrow home with home health physical therapy  DVT Prophylaxis - Lovenox, Foot Pumps and TED hose Weight-Bearing as tolerated to right leg  Dedra Skeensodd Cherolyn Behrle, PA-C Orthopaedic Surgery 11/25/2016, 7:12 AM

## 2016-11-26 LAB — SURGICAL PATHOLOGY

## 2016-11-26 MED ORDER — HYDROCODONE-ACETAMINOPHEN 7.5-325 MG PO TABS: 1.0000 | ORAL_TABLET | ORAL | 0 refills | Status: DC | PRN

## 2016-11-26 NOTE — Progress Notes (Signed)
Patient was discharged home with mother. Dressing changed. Patient removed IV with cath intact. BM before discharge. Reviewed discharge paperwork along with script and last dose given. Allowed time for questions.

## 2016-11-26 NOTE — Discharge Summary (Signed)
Physician Discharge Summary  Subjective: 2 Days Post-Op Procedure(s) (LRB): TOTAL HIP ARTHROPLASTY ANTERIOR APPROACH (Right) Patient reports pain as mild.   Patient seen in rounds with Dr. Rosita KeaMenz. Patient is well, and has had no acute complaints or problems Patient is ready to go Home with home health physical therapy.  Physician Discharge Summary  Patient ID: Angel Bautista MRN: [id number] DOB/AGE: 48-May-1972 48 y.o.  Admit date: 11/24/2016 Discharge date: 11/26/2016  Admission Diagnoses:  Discharge Diagnoses:  Active Problems:   Primary localized osteoarthritis of right hip   Discharged Condition: good  Hospital Course: The patient is postop day 2 from a right anterior approach total hip replacement. He did very well on postop day 1 although he was working on pain management. He is improving now. He ambulated 90 feet with physical therapy. Patient is ready to go home today after having a bowel movement.  Treatments: surgery:  TOTAL HIP ARTHROPLASTY ANTERIOR APPROACH (Right)  SURGEON: Leitha SchullerMichael J Menz, MD  ASSISTANTS: None  ANESTHESIA:   general  EBL:  Total I/O In: 1600 [I.V.:1600] Out: 350 [Urine:100; Blood:250]  BLOOD ADMINISTERED:none  DRAINS: none   LOCAL MEDICATIONS USED:  MARCAINE     SPECIMEN:  Source of Specimen:  Right femoral head  DISPOSITION OF SPECIMEN:  PATHOLOGY  COUNTS:  YES  TOURNIQUET:  * No tourniquets in log *  IMPLANTS: Medacta AMIS 3 lateralized stem with 56 Mpact DM cup, S 28 mm head and liner  Discharge Exam: Blood pressure (!) 166/76, pulse 89, temperature 98 F (36.7 C), temperature source Oral, resp. rate 18, height 5\' 11"  (1.803 m), weight 90.3 kg (199 lb), SpO2 96 %.   Disposition: 01-Home or Self Care   Allergies as of 11/26/2016      Reactions   Tramadol Itching   Penicillins Swelling      Medication List    TAKE these medications   diphenhydrAMINE 25 MG tablet Commonly known as:  BENADRYL Take 25 mg by  mouth every evening.   enoxaparin 40 MG/0.4ML injection Commonly known as:  LOVENOX Inject 0.4 mLs (40 mg total) into the skin daily.   HYDROcodone-acetaminophen 7.5-325 MG tablet Commonly known as:  NORCO Take 1-2 tablets by mouth every 4 (four) hours as needed for moderate pain.            Durable Medical Equipment        Start     Ordered   11/24/16 1752  DME Bedside commode  Once    Question:  Patient needs a bedside commode to treat with the following condition  Answer:  Status post total hip replacement, right   11/24/16 1751   11/24/16 1752  DME 3 n 1  Once     11/24/16 1751   11/24/16 1752  DME Walker rolling  Once    Question:  Patient needs a walker to treat with the following condition  Answer:  Status post total hip replacement, right   11/24/16 1751     Follow-up Information    Bautista,MICHAEL, MD Follow up in 2 week(s).   Specialty:  Orthopedic Surgery Why:  For staple removal Contact information: [address] [facility] [place] [address] 249 [phone]           Signed: Lenard ForthMUNDY, [name] 11/26/2016, 6:17 AM   Objective: Vital signs in last 24 hours: Temp:  [97.9 F (36.6 C)-99.6 F (37.6 C)] 98 F (36.7 C) (01/25 0333) Pulse Rate:  [80-99] 89 (01/25 0333) Resp:  [  18-19] 18 (01/25 0333) BP: (152-176)/(65-95) 166/76 (01/25 0333) SpO2:  [96 %-98 %] 96 % (01/25 0333)  Intake/Output from previous day:  Intake/Output Summary (Last 24 hours) at 11/26/16 0617 Last data filed at 11/26/16 0527  Gross per 24 hour  Intake              480 ml  Output             2765 ml  Net            -2285 ml    Intake/Output this shift: Total I/O In: -  Out: 2200 [Urine:2200]  Labs:  Recent Labs  11/24/16 1806 11/25/16 0507  HGB 14.4 13.3    Recent Labs  11/24/16 1806 11/25/16 0507  WBC 9.2 6.1  RBC 4.51 4.11*  HCT 40.8 37.5*  PLT 151 133*    Recent Labs  11/24/16 1806 11/25/16 0507  NA  --  135  K  --  4.0  CL   --  101  CO2  --  28  BUN  --  17  CREATININE 1.42* 1.25*  GLUCOSE  --  98  CALCIUM  --  8.2*   No results for input(s): LABPT, INR in the last 72 hours.  EXAM: General - Patient is Alert and Oriented Extremity - Sensation intact distally Intact pulses distally Compartment soft Incision - clean, dry, blood tinged drainage Motor Function -  the patient can plantarflex and dorsiflex his feet. He can ambulate well with a walker and physical therapy.  Assessment/Plan: 2 Days Post-Op Procedure(s) (LRB): TOTAL HIP ARTHROPLASTY ANTERIOR APPROACH (Right) Procedure(s) (LRB): TOTAL HIP ARTHROPLASTY ANTERIOR APPROACH (Right) Past Medical History:  Diagnosis Date  . Arthritis   . Hypertension   . Palpitations    Active Problems:   Primary localized osteoarthritis of right hip  Estimated body mass index is 27.75 kg/m as calculated from the following:   Height as of this encounter: 5\' 11"  (1.803 m).   Weight as of this encounter: 90.3 kg (199 lb). Discharge home with home health Diet - Regular diet Follow up - in 2 weeks Activity - WBAT Disposition - Home Condition Upon Discharge - Good DVT Prophylaxis - Lovenox and TED hose  Dedra Skeens, PA-C Orthopaedic Surgery 11/26/2016, 6:17 AM

## 2016-11-26 NOTE — Progress Notes (Signed)
Physical Therapy Treatment Patient Details Name: Angel Bautista MRN: [id number] DOB: Aug 08, 1971 Today's Date: 11/26/2016    History of Present Illness Pt underwent R THR anterior approach without reported post-op complications. PT evaluation performed at POD#1.     PT Comments    Pt able to ambulate around nursing station and up/down 4 stairs this am without difficulty and use of rolling walker.  Overall doing well and confident with discharge plan.  Anticipate discharge home today.  Follow Up Recommendations  Home health PT;Outpatient PT     Equipment Recommendations  Rolling walker with 5" wheels;Other (comment)    Recommendations for Other Services       Precautions / Restrictions Precautions Precautions: Anterior Hip Precaution Comments: pt reports having L hip replacement last year with anterior approach  Restrictions Weight Bearing Restrictions: Yes RLE Weight Bearing: Weight bearing as tolerated    Mobility  Bed Mobility Overal bed mobility: Needs Assistance Bed Mobility: Supine to Sit;Sit to Supine     Supine to sit: Modified independent (Device/Increase time) Sit to supine: Modified independent (Device/Increase time)      Transfers Overall transfer level: Modified independent Equipment used: Rolling walker (2 wheeled) Transfers: Sit to/from Stand Sit to Stand: Supervision         General transfer comment: Pt demonstrates improving speed/sequencing. Safe hand placement demonstrated. Continues to demonstrate decreased weight shifting to RLE  Ambulation/Gait Ambulation/Gait assistance: Supervision Ambulation Distance (Feet): 200 Feet Assistive device: Rolling walker (2 wheeled) Gait Pattern/deviations: Step-through pattern Gait velocity: Decreased   General Gait Details: to/from therapy gym and up/down stairs without rest this am   Stairs Stairs: Yes   Stair Management: Two rails Number of Stairs: 4 General stair comments: steady with good  step pattern  Wheelchair Mobility    Modified Rankin (Stroke Patients Only)       Balance Overall balance assessment: Needs assistance Sitting-balance support: No upper extremity supported Sitting balance-Leahy Scale: Normal     Standing balance support: No upper extremity supported;Bilateral upper extremity supported Standing balance-Leahy Scale: Good                      Cognition Arousal/Alertness: Awake/alert Behavior During Therapy: WFL for tasks assessed/performed Overall Cognitive Status: Within Functional Limits for tasks assessed                      Exercises Other Exercises Other Exercises: Pt given and reviewed HEP packet.  Pt declined to return demo stating he was familiar with them from last suregry and will no upon discharge    General Comments        Pertinent Vitals/Pain Pain Assessment: 0-10 Pain Score: 7  Pain Location: R hip Pain Descriptors / Indicators: Sore    Home Living                      Prior Function            PT Goals (current goals can now be found in the care plan section) Progress towards PT goals: Progressing toward goals    Frequency    BID      PT Plan Current plan remains appropriate    Co-evaluation             End of Session Equipment Utilized During Treatment: Gait belt Activity Tolerance: Patient tolerated treatment well Patient left: with call bell/phone within reach     Time: [id number] PT Time Calculation (min) (ACUTE  ONLY): 16 min  Charges:  $Gait Training: 8-22 mins                    G Codes:      Danielle Dess 12/25/16, 10:29 AM

## 2016-11-26 NOTE — Care Management (Addendum)
[facility] pharmacy does not open until 0900. I will re-attempt to call them for Lovenox price [phone] unless patient already knows- I don't think [facility] has patient's insurance on file. Patient indicated that he was not going to take the shots. Patient advised to call RNCM with any problems with cost for Lovenox- he agreed. Dr. Rosita KeaMenz told this RNCM that OPPT has been set up with his office. Case closed.

## 2016-11-26 NOTE — Progress Notes (Signed)
  Subjective: 2 Days Post-Op Procedure(s) (LRB): TOTAL HIP ARTHROPLASTY ANTERIOR APPROACH (Right) Patient reports pain as mild.   Patient seen in rounds with Dr. Rosita KeaMenz. Patient is well, and has had no acute complaints or problems Plan is to go Home after hospital stay. Negative for chest pain and shortness of breath Fever: no Gastrointestinal: Negative for nausea and vomiting  Objective: Vital signs in last 24 hours: Temp:  [97.9 F (36.6 C)-99.6 F (37.6 C)] 98 F (36.7 C) (01/25 0333) Pulse Rate:  [80-99] 89 (01/25 0333) Resp:  [18-19] 18 (01/25 0333) BP: (152-176)/(65-95) 166/76 (01/25 0333) SpO2:  [96 %-98 %] 96 % (01/25 0333)  Intake/Output from previous day:  Intake/Output Summary (Last 24 hours) at 11/26/16 0614 Last data filed at 11/26/16 0527  Gross per 24 hour  Intake              480 ml  Output             2765 ml  Net            -2285 ml    Intake/Output this shift: Total I/O In: -  Out: 2200 [Urine:2200]  Labs:  Recent Labs  11/24/16 1806 11/25/16 0507  HGB 14.4 13.3    Recent Labs  11/24/16 1806 11/25/16 0507  WBC 9.2 6.1  RBC 4.51 4.11*  HCT 40.8 37.5*  PLT 151 133*    Recent Labs  11/24/16 1806 11/25/16 0507  NA  --  135  K  --  4.0  CL  --  101  CO2  --  28  BUN  --  17  CREATININE 1.42* 1.25*  GLUCOSE  --  98  CALCIUM  --  8.2*   No results for input(s): LABPT, INR in the last 72 hours.   EXAM General - Patient is Alert and Oriented Extremity - Sensation intact distally Intact pulses distally Dorsiflexion/Plantar flexion intact No cellulitis present Compartment soft Dressing/Incision - clean, dry, blood tinged drainage Motor Function - intact, moving foot and toes well on exam. The patient ambulated 90 feet with physical therapy.  Past Medical History:  Diagnosis Date  . Arthritis   . Hypertension   . Palpitations     Assessment/Plan: 2 Days Post-Op Procedure(s) (LRB): TOTAL HIP ARTHROPLASTY ANTERIOR APPROACH  (Right) Active Problems:   Primary localized osteoarthritis of right hip  Estimated body mass index is 27.75 kg/m as calculated from the following:   Height as of this encounter: 5\' 11"  (1.803 m).   Weight as of this encounter: 90.3 kg (199 lb). Continue physical therapy today. Bowel management. Discharged today home with home health physical therapy  DVT Prophylaxis - Lovenox, Foot Pumps and TED hose Weight-Bearing as tolerated to right leg  Dedra Skeensodd Gabriela Giannelli, PA-C Orthopaedic Surgery 11/26/2016, 6:14 AM

## 2016-11-27 NOTE — Anesthesia Postprocedure Evaluation (Signed)
Anesthesia Post Note  Patient: Angel Bautista  Procedure(s) Performed: Procedure(s) (LRB): TOTAL HIP ARTHROPLASTY ANTERIOR APPROACH (Right)  Patient location during evaluation: PACU Anesthesia Type: General Level of consciousness: awake and alert and oriented Pain management: pain level controlled Vital Signs Assessment: post-procedure vital signs reviewed and stable Respiratory status: spontaneous breathing Cardiovascular status: blood pressure returned to baseline Anesthetic complications: no     Last Vitals:  Vitals:   11/26/16 0748 11/26/16 1630  BP: (!) 152/81 (!) 150/78  Pulse: 87 89  Resp: 16 16  Temp: 37.1 C 37 C    Last Pain:  Vitals:   11/26/16 1630  TempSrc: Oral  PainSc:                  Mckinzee Spirito

## 2018-05-02 DIAGNOSIS — I10 Essential (primary) hypertension: Secondary | ICD-10-CM | POA: Insufficient documentation

## 2018-05-06 DIAGNOSIS — A6000 Herpesviral infection of urogenital system, unspecified: Secondary | ICD-10-CM | POA: Insufficient documentation

## 2019-09-25 ENCOUNTER — Other Ambulatory Visit: Payer: Self-pay | Admitting: *Deleted

## 2019-09-25 DIAGNOSIS — Z20822 Contact with and (suspected) exposure to covid-19: Secondary | ICD-10-CM

## 2019-09-27 LAB — NOVEL CORONAVIRUS, NAA: SARS-CoV-2, NAA: NOT DETECTED

## 2019-12-27 ENCOUNTER — Other Ambulatory Visit: Payer: Self-pay

## 2019-12-27 ENCOUNTER — Ambulatory Visit (INDEPENDENT_AMBULATORY_CARE_PROVIDER_SITE_OTHER): Payer: BLUE CROSS/BLUE SHIELD | Admitting: Family Medicine

## 2019-12-27 ENCOUNTER — Encounter: Payer: Self-pay | Admitting: Family Medicine

## 2019-12-27 VITALS — BP 148/78 | HR 92 | Temp 98.8°F | Ht 69.75 in | Wt 207.2 lb

## 2019-12-27 DIAGNOSIS — Z1211 Encounter for screening for malignant neoplasm of colon: Secondary | ICD-10-CM

## 2019-12-27 DIAGNOSIS — Z79890 Hormone replacement therapy (postmenopausal): Secondary | ICD-10-CM

## 2019-12-27 DIAGNOSIS — N529 Male erectile dysfunction, unspecified: Secondary | ICD-10-CM

## 2019-12-27 DIAGNOSIS — Z125 Encounter for screening for malignant neoplasm of prostate: Secondary | ICD-10-CM | POA: Diagnosis not present

## 2019-12-27 DIAGNOSIS — E7800 Pure hypercholesterolemia, unspecified: Secondary | ICD-10-CM

## 2019-12-27 DIAGNOSIS — I10 Essential (primary) hypertension: Secondary | ICD-10-CM | POA: Diagnosis not present

## 2019-12-27 DIAGNOSIS — E663 Overweight: Secondary | ICD-10-CM

## 2019-12-27 DIAGNOSIS — E349 Endocrine disorder, unspecified: Secondary | ICD-10-CM

## 2019-12-27 DIAGNOSIS — Z7689 Persons encountering health services in other specified circumstances: Secondary | ICD-10-CM

## 2019-12-27 DIAGNOSIS — A6000 Herpesviral infection of urogenital system, unspecified: Secondary | ICD-10-CM | POA: Diagnosis not present

## 2019-12-27 DIAGNOSIS — R7989 Other specified abnormal findings of blood chemistry: Secondary | ICD-10-CM

## 2019-12-27 MED ORDER — LISINOPRIL 20 MG PO TABS: 20.0000 mg | ORAL_TABLET | Freq: Every day | ORAL | 3 refills | Status: DC

## 2019-12-27 MED ORDER — HYDROCHLOROTHIAZIDE 25 MG PO TABS: 25.0000 mg | ORAL_TABLET | Freq: Every day | ORAL | 3 refills | Status: DC

## 2019-12-27 MED ORDER — VALACYCLOVIR HCL 500 MG PO TABS: ORAL_TABLET | ORAL | 1 refills | Status: DC

## 2019-12-27 NOTE — Progress Notes (Signed)
   Subjective:    Patient ID: Angel Bautista, male    DOB: 09-05-1971, 49 y.o.   MRN: [id number]  HPI Chief Complaint  Patient presents with  . Establish Care  Previously saw Dr. Tawanna Cooler.   Last CPE- awhile PSA- never Colonoscopy- never Tdap- thinks 2015 Flu- declines Dental- regular Eye- never Exercise- weights, daily  Herpes- flare every 4-6 months, has had for about 1 year. Girlfriend positive as well.   BP- no side effects from medication  ED- has noticed decreased ability to sustain erection. Has been using testosterone Cyponate ("pharmaceutical grade") every week for several years.     Review of Systems Denies chest pain, SOB, abdominal pain, diarrhea, constipation, blood in stool, occasional knee pain, can throw back out    Objective:   Physical Exam Constitutional:      General: He is not in acute distress.    Appearance: Normal appearance. He is not ill-appearing, toxic-appearing or diaphoretic.  HENT:     Head: Normocephalic and atraumatic.  Eyes:     Conjunctiva/sclera: Conjunctivae normal.  Cardiovascular:     Rate and Rhythm: Normal rate.  Pulmonary:     Effort: Pulmonary effort is normal.  Neurological:     Mental Status: He is alert and oriented to person, place, and time.  Psychiatric:        Mood and Affect: Mood normal.        Behavior: Behavior normal.        Thought Content: Thought content normal.        Judgment: Judgment normal.       BP (!) 180/98   Pulse 92   Temp 98.8 F (37.1 C) (Temporal)   Ht 5' 9.75" (1.772 m)   Wt 207 lb 4 oz (94 kg)   SpO2 93%   BMI 29.95 kg/m  Wt Readings from Last 3 Encounters:  12/27/19 207 lb 4 oz (94 kg)  11/24/16 199 lb (90.3 kg)  11/19/16 206 lb (93.4 kg)   BP: (!) 148/78        Assessment & Plan:  1. Encounter to establish care - available records in EMR reviewed - follow up in 6 months for CPE - reviewed overdue health maintenance  2. Genital herpes simplex, unspecified site -  valACYclovir (VALTREX) 500 MG tablet; Take 2 tablets x 2 doses as needed for break out  Dispense: 20 tablet; Refill: 1  3. Essential hypertension - elevated initial reading, improved on rechec - continue current meds  4. Overweight - CBC with Differential - Comprehensive metabolic panel - Lipid Panel  5. Long-term current use of testosterone replacement therapy - Testosterone - PSA  6. Erectile dysfunction, unspecified erectile dysfunction type - will check labs and if ok, consider medication  7. Encounter for prostate cancer screening - PSA  8. Screening for colon cancer - agrees to referral for colonoscopy - Ambulatory referral to Gastroenterology  Over 45 minutes were spent face-to-face with the patient during this encounter and >50% of that time was spent on counseling and coordination of care  This visit occurred during the SARS-CoV-2 public health emergency.  Safety protocols were in place, including screening questions prior to the visit, additional usage of staff PPE, and extensive cleaning of exam room while observing appropriate contact time as indicated for disinfecting solutions.    Olean Ree, FNP-BC  [facility], [facility]  12/29/2019 8:21 AM

## 2019-12-27 NOTE — Patient Instructions (Addendum)
Can take valacyclovir 2 tabs every 12 hours x 2 doses- refill sent to pharmacy  Please schedule an appointment for complete physical in 6 months

## 2019-12-28 LAB — CBC WITH DIFFERENTIAL/PLATELET
Basophils Absolute: 0 10*3/uL (ref 0.0–0.1)
Basophils Relative: 0.7 % (ref 0.0–3.0)
Eosinophils Absolute: 0.1 10*3/uL (ref 0.0–0.7)
Eosinophils Relative: 1.2 % (ref 0.0–5.0)
HCT: 43.8 % (ref 39.0–52.0)
Hemoglobin: 15.7 g/dL (ref 13.0–17.0)
Lymphocytes Relative: 17.3 % (ref 12.0–46.0)
Lymphs Abs: 0.9 10*3/uL (ref 0.7–4.0)
MCHC: 35.7 g/dL (ref 30.0–36.0)
MCV: 90 fl (ref 78.0–100.0)
Monocytes Absolute: 0.7 10*3/uL (ref 0.1–1.0)
Monocytes Relative: 13.9 % — ABNORMAL HIGH (ref 3.0–12.0)
Neutro Abs: 3.5 10*3/uL (ref 1.4–7.7)
Neutrophils Relative %: 66.9 % (ref 43.0–77.0)
Platelets: 184 10*3/uL (ref 150.0–400.0)
RBC: 4.87 Mil/uL (ref 4.22–5.81)
RDW: 12.8 % (ref 11.5–15.5)
WBC: 5.3 10*3/uL (ref 4.0–10.5)

## 2019-12-28 LAB — COMPREHENSIVE METABOLIC PANEL
ALT: 57 U/L — ABNORMAL HIGH (ref 0–53)
AST: 46 U/L — ABNORMAL HIGH (ref 0–37)
Albumin: 4.8 g/dL (ref 3.5–5.2)
Alkaline Phosphatase: 53 U/L (ref 39–117)
BUN: 16 mg/dL (ref 6–23)
CO2: 31 mEq/L (ref 19–32)
Calcium: 10.2 mg/dL (ref 8.4–10.5)
Chloride: 89 mEq/L — ABNORMAL LOW (ref 96–112)
Creatinine, Ser: 1.35 mg/dL (ref 0.40–1.50)
GFR: 55.84 mL/min — ABNORMAL LOW (ref >60.00)
Glucose, Bld: 92 mg/dL (ref 70–99)
Potassium: 4 mEq/L (ref 3.5–5.1)
Sodium: 130 mEq/L — ABNORMAL LOW (ref 135–145)
Total Bilirubin: 0.8 mg/dL (ref 0.2–1.2)
Total Protein: 7.1 g/dL (ref 6.0–8.3)

## 2019-12-28 LAB — LIPID PANEL
Cholesterol: 166 mg/dL (ref 0–200)
HDL: 33.3 mg/dL — ABNORMAL LOW (ref >39.00)
LDL Cholesterol: 111 mg/dL — ABNORMAL HIGH (ref 0–99)
NonHDL: 132.78
Total CHOL/HDL Ratio: 5
Triglycerides: 109 mg/dL (ref 0.0–149.0)
VLDL: 21.8 mg/dL (ref 0.0–40.0)

## 2019-12-28 LAB — PSA: PSA: 3.37 ng/mL (ref 0.10–4.00)

## 2019-12-28 LAB — TESTOSTERONE: Testosterone: 1305.1 ng/dL — ABNORMAL HIGH (ref 300.00–890.00)

## 2019-12-29 ENCOUNTER — Encounter: Payer: Self-pay | Admitting: Family Medicine

## 2020-01-02 MED ORDER — SILDENAFIL CITRATE 20 MG PO TABS: ORAL_TABLET | ORAL | 1 refills | Status: DC

## 2020-01-02 NOTE — Addendum Note (Signed)
Addended by: Olean Ree B on: 01/02/2020 08:27 AM   Modules accepted: Orders

## 2020-03-04 ENCOUNTER — Encounter: Payer: Self-pay | Admitting: Internal Medicine

## 2020-03-04 ENCOUNTER — Encounter: Payer: Self-pay | Admitting: Family Medicine

## 2020-06-26 ENCOUNTER — Other Ambulatory Visit: Payer: Self-pay

## 2020-06-26 ENCOUNTER — Ambulatory Visit (INDEPENDENT_AMBULATORY_CARE_PROVIDER_SITE_OTHER): Payer: BLUE CROSS/BLUE SHIELD | Admitting: Family Medicine

## 2020-06-26 ENCOUNTER — Encounter: Payer: Self-pay | Admitting: Family Medicine

## 2020-06-26 VITALS — BP 140/80 | HR 82 | Temp 98.5°F | Ht 70.0 in | Wt 207.0 lb

## 2020-06-26 DIAGNOSIS — Z1211 Encounter for screening for malignant neoplasm of colon: Secondary | ICD-10-CM

## 2020-06-26 DIAGNOSIS — Z23 Encounter for immunization: Secondary | ICD-10-CM | POA: Diagnosis not present

## 2020-06-26 DIAGNOSIS — E7800 Pure hypercholesterolemia, unspecified: Secondary | ICD-10-CM

## 2020-06-26 DIAGNOSIS — E349 Endocrine disorder, unspecified: Secondary | ICD-10-CM | POA: Diagnosis not present

## 2020-06-26 DIAGNOSIS — R7989 Other specified abnormal findings of blood chemistry: Secondary | ICD-10-CM | POA: Diagnosis not present

## 2020-06-26 DIAGNOSIS — Z0000 Encounter for general adult medical examination without abnormal findings: Secondary | ICD-10-CM | POA: Diagnosis not present

## 2020-06-26 DIAGNOSIS — Z9189 Other specified personal risk factors, not elsewhere classified: Secondary | ICD-10-CM

## 2020-06-26 NOTE — Patient Instructions (Signed)
Good to see you today  I will notify you of lab results via Mychart  Please call your insurance company and see if they will cover Colguard Colon Cancer Screening Test- call the office or send me a mychart message to let me know to order it or to put in new referral for colonoscopy  Please consider getting your Covid Vaccine  Wear sunscreen daily- at least on your face, head and neck  Follow up to be determined by lab results

## 2020-06-26 NOTE — Progress Notes (Signed)
Subjective:    Patient ID: Angel Bautista, male    DOB: [birth year], 48 y.o.   MRN: [id number]  HPI Chief Complaint  Patient presents with  . Annual Exam  . Shoulder Pain    right- pain increasing x 1 year    This is a 48 yo male who presents today for CPE.    Last CPE- years ago PSA- 12/27/19 Colonoscopy- never, did not return call to GI, interested in Cologard Tdap- unsure, will have today Flu- not usually Dental- regular Eye- unknown Exercise- regular ETOH- drinks 4 beers a night  Elevated testosterone- using 250 mg every 2 weeks. Did not follow up for recheck of labs.   ED- using sildenafil with good results    Review of Systems  Constitutional: Negative.   HENT: Negative.   Eyes: Negative.   Respiratory: Negative.   Cardiovascular: Negative.   Gastrointestinal: Negative.   Endocrine: Negative.   Genitourinary: Negative.   Musculoskeletal:       Chronic right shoulder pain x several years. Seems to be getting worse.   Skin: Negative.   Allergic/Immunologic: Negative.   Neurological: Positive for headaches (occasonal).  Hematological: Negative.   Psychiatric/Behavioral: Negative.        Objective:   Physical Exam Physical Exam  Constitutional: He is oriented to person, place, and time. He appears well-developed and well-nourished.  HENT:  Head: Normocephalic and atraumatic.  Right Ear: External ear normal. TM normal.  Left Ear: External ear normal.  TM normal.  Nose: Nose normal.  Mouth/Throat: Oropharynx is clear and moist.  Eyes: Conjunctivae are normal.  Neck: Normal range of motion. Neck supple.  Cardiovascular: Normal rate, regular rhythm, normal heart sounds and intact distal pulses.   Pulmonary/Chest: Effort normal and breath sounds normal.  Abdominal: Soft. Bowel sounds are normal. Hernia confirmed negative in the right inguinal area and confirmed negative in the left inguinal area.  Genitourinary: Testes normal and penis normal.  Circumcised.  Musculoskeletal: Normal range of motion. He exhibits no edema or tenderness.       Cervical back: Normal.       Thoracic back: Normal.       Lumbar back: Normal.  Right shoulder with decreased ROM, anterior tenderness, clicking with movement.  Lymphadenopathy:    He has no cervical adenopathy.       Right: No inguinal adenopathy present.       Left: No inguinal adenopathy present.  Neurological: He is alert and oriented to person, place, and time.  Skin: Skin is warm and dry. Very tan face, head, trunk.  Psychiatric: He has a normal mood and affect. His behavior is normal. Judgment normal.  Vitals reviewed.     BP 140/80   Pulse 82   Temp 98.5 F (36.9 C) (Temporal)   Ht 5\' 10"  (1.778 m)   Wt 207 lb (93.9 kg)   SpO2 96%   BMI 29.70 kg/m  Wt Readings from Last 3 Encounters:  06/26/20 207 lb (93.9 kg)  12/27/19 207 lb 4 oz (94 kg)  11/24/16 199 lb (90.3 kg)       Assessment & Plan:  1. Annual physical exam - Discussed and encouraged healthy lifestyle choices- adequate sleep, regular exercise, stress management and healthy food choices.  - encouraged him to limit ETOH to 2 or fewer drinks per day  2. Need for Tdap vaccination - Tdap vaccine greater than or equal to 7yo IM  3. Elevated LFTs - Comprehensive metabolic panel  4.  Elevated cholesterol - Lipid panel  5. Elevated testosterone level in male - Testosterone  6. Screening for colon cancer - provided him with number to call to see if Cologuard is covered by his insurance  7. Excessive tanning - encouraged him to use sun screen daily  - follow up to be determined by labs  This visit occurred during the SARS-CoV-2 public health emergency.  Safety protocols were in place, including screening questions prior to the visit, additional usage of staff PPE, and extensive cleaning of exam room while observing appropriate contact time as indicated for disinfecting solutions.      Olean Ree,  FNP-BC   [facility] at [facility], [facility]  06/26/2020 5:13 PM

## 2020-06-27 LAB — COMPREHENSIVE METABOLIC PANEL
ALT: 35 U/L (ref 0–53)
AST: 33 U/L (ref 0–37)
Albumin: 4.6 g/dL (ref 3.5–5.2)
Alkaline Phosphatase: 46 U/L (ref 39–117)
BUN: 21 mg/dL (ref 6–23)
CO2: 32 mEq/L (ref 19–32)
Calcium: 9.8 mg/dL (ref 8.4–10.5)
Chloride: 92 mEq/L — ABNORMAL LOW (ref 96–112)
Creatinine, Ser: 1.35 mg/dL (ref 0.40–1.50)
GFR: 55.73 mL/min — ABNORMAL LOW (ref >60.00)
Glucose, Bld: 104 mg/dL — ABNORMAL HIGH (ref 70–99)
Potassium: 4.5 mEq/L (ref 3.5–5.1)
Sodium: 130 mEq/L — ABNORMAL LOW (ref 135–145)
Total Bilirubin: 0.8 mg/dL (ref 0.2–1.2)
Total Protein: 6.7 g/dL (ref 6.0–8.3)

## 2020-06-27 LAB — LIPID PANEL
Cholesterol: 145 mg/dL (ref 0–200)
HDL: 35 mg/dL — ABNORMAL LOW (ref >39.00)
LDL Cholesterol: 89 mg/dL (ref 0–99)
NonHDL: 110.07
Total CHOL/HDL Ratio: 4
Triglycerides: 105 mg/dL (ref 0.0–149.0)
VLDL: 21 mg/dL (ref 0.0–40.0)

## 2020-06-27 LAB — TESTOSTERONE: Testosterone: 752.97 ng/dL (ref 300.00–890.00)

## 2020-07-01 ENCOUNTER — Other Ambulatory Visit: Payer: Self-pay

## 2020-07-01 ENCOUNTER — Encounter: Payer: Self-pay | Admitting: Family Medicine

## 2020-07-01 ENCOUNTER — Ambulatory Visit (INDEPENDENT_AMBULATORY_CARE_PROVIDER_SITE_OTHER)
Admission: RE | Admit: 2020-07-01 | Discharge: 2020-07-01 | Disposition: A | Discharge status: Home / Self Care | Payer: BLUE CROSS/BLUE SHIELD | Source: Ambulatory Visit | Attending: Family Medicine | Admitting: Family Medicine

## 2020-07-01 ENCOUNTER — Ambulatory Visit (INDEPENDENT_AMBULATORY_CARE_PROVIDER_SITE_OTHER): Payer: BLUE CROSS/BLUE SHIELD | Admitting: Family Medicine

## 2020-07-01 VITALS — BP 140/78 | HR 86 | Temp 98.7°F | Ht 66.0 in | Wt 207.5 lb

## 2020-07-01 DIAGNOSIS — G8929 Other chronic pain: Secondary | ICD-10-CM | POA: Diagnosis not present

## 2020-07-01 DIAGNOSIS — M25511 Pain in right shoulder: Secondary | ICD-10-CM | POA: Diagnosis not present

## 2020-07-01 DIAGNOSIS — M6281 Muscle weakness (generalized): Secondary | ICD-10-CM

## 2020-07-01 NOTE — Progress Notes (Signed)
Angel Hudlow T. Gillis Boardley, MD, CAQ Sports Medicine  Primary Care and Sports Medicine [facility] at [facility] [address] [place], [address]  Phone: [phone]  FAX: [phone]  Angel Bautista - 49 y.o. male  MRN [id number]  Date of Birth: 1971/10/17  Date: 07/01/2020  PCP: Emi Belfast, FNP  Referral: Emi Belfast, FNP  Chief Complaint  Patient presents with  . Shoulder Pain    Right    This visit occurred during the SARS-CoV-2 public health emergency.  Safety protocols were in place, including screening questions prior to the visit, additional usage of staff PPE, and extensive cleaning of exam room while observing appropriate contact time as indicated for disinfecting solutions.   Subjective:   Angel Bautista is a 49 y.o. very pleasant male patient with Body mass index is 33.49 kg/m. who presents with the following:  R shoulder, slowly aggravating.  He is a very pleasant and he is normally quite active and has been lifting weights for many decades.  He presents for some ongoing weakness in his right shoulder in the plane of abduction, and he has unstable shoulder mechanics with lifting or pressing.  He typically is having to decrease his weight by about 60 to 70% on the right side compared to the left.  This is with dumbbell movements.  He was unable to do any barbell lifting.  This is not entirely new, and he did feel a a pop and tearing greater than 1 year ago.  He had does have a dull ache and pain in the anterolateral shoulder.  He has been taking a lot of NSAIDs without significant relief of symptoms.  He is here today concerned about his lack of function in the right shoulder.  Noticed it a lot.  Did dumbbell presses.  When younger he was going heavier.  Felt a pop.  THen it got better, then worse.  Now it started about four or five years ago.  Will ache and taking a lot more nsaids.    Review of Systems is noted in the HPI, as  appropriate   Objective:   BP 140/78   Pulse 86   Temp 98.7 F (37.1 C) (Temporal)   Ht 5\' 6"  (1.676 m)   Wt 207 lb 8 oz (94.1 kg)   SpO2 96%   BMI 33.49 kg/m    GEN: No acute distress; alert,appropriate. PULM: Breathing comfortably in no respiratory distress PSYCH: Normally interactive.   Shoulder: R Inspection: He does have some winging and elevation of the scapula with abduction and flexion  ecchymosis/edema: neg  AC joint, scapula, clavicle: NT Cervical spine: NT, full ROM Spurling's: neg Abduction: Loss of 35 degrees, 3 plus to 3/5 -drop test is positive Flexion: Loss of 35 degrees, 3+/5 IR, full, lift-off: 0 with the shoulder abducted to 90, 5/5 ER at neutral:  20 degrees with the shoulder abducted to 90 4/5 AC crossover: pain Additional special testing cannot be completed secondary to loss of motion and pain Empty Can: pos Supraspinatus insertion: mild-mod T Bicipital groove: Positive Speed's: Positive Yergason's: Positive  sulcus sign: neg Scapular dyskinesis: Elevation of the scapula with abduction C5-T1 intact  Neuro: Sensation intact Grip 5/5   Radiology: DG Shoulder Right  Result Date: 07/02/2020 CLINICAL DATA:  Worsening range of motion and shoulder pain. Weakness and loss of motion. EXAM: RIGHT SHOULDER - 2+ VIEW COMPARISON:  None. FINDINGS: Acromioclavicular osteoarthritis with inferior osteophytes. Degenerative changes of the greater  tuberosity with cystic change and osteophytes. Mild glenohumeral degenerative change. No acute fracture. No dislocation. IMPRESSION: 1. No acute fracture or dislocation. 2. Findings suggestive of rotator cuff tendinopathy, as detailed above. MRI could better characterize, if clinically indicated. Electronically Signed   By: Feliberto Harts MD   On: 07/02/2020 15:23     Assessment and Plan:     ICD-10-CM   1. Chronic right shoulder pain  M25.511 MR Shoulder Right Wo Contrast   G89.29 CANCELED: MR SHOULDER RIGHT W  CONTRAST    CANCELED: DG FLUORO GUIDED NEEDLE PLC ASPIRATION/INJECTION LOC  2. Acute pain of right shoulder  M25.511 DG Shoulder Right    MR Shoulder Right Wo Contrast    CANCELED: MR SHOULDER RIGHT W CONTRAST    CANCELED: DG FLUORO GUIDED NEEDLE PLC ASPIRATION/INJECTION LOC  3. Muscle weakness of right upper extremity  M62.81 MR Shoulder Right Wo Contrast    CANCELED: MR SHOULDER RIGHT W CONTRAST    CANCELED: DG FLUORO GUIDED NEEDLE PLC ASPIRATION/INJECTION LOC   The patient has a markedly abnormal exam with a markedly abnormal history in a patient who has been a Press photographer from decades.  At this point his strength and stability in the right shoulder is decidedly abnormal compared to his contralateral side.  His strength in the plane of abduction is minimal approximately 3+/5 with 5/5 in the contralateral side and global strength that is exceptional.  Additional osteophyte formation and subchondral sclerosis in the proximal humeral head as well as inferior aspect of the acromium but outside of the glenohumeral joint.  This is quite abnormal.  Obtain an MRI of the right shoulder to evaluate for full-thickness rotator cuff tear.  Markedly abnormal exam, strength, range of motion, stability with movement in any form of chest press or overhead press.  Abduction is decidedly abnormal.  Positive drop test.  He has failed conservative management for greater than 1 year in a highly trained weightlifter.  Follow-up: No follow-ups on file.  No orders of the defined types were placed in this encounter.  There are no discontinued medications. Orders Placed This Encounter  Procedures  . DG Shoulder Right  . MR Shoulder Right Wo Contrast    Signed,  Karlin Binion T. Rush Salce, MD   Outpatient Encounter Medications as of 07/01/2020  Medication Sig  . diphenhydrAMINE (BENADRYL) 25 MG tablet Take 25 mg by mouth every evening.  . hydrochlorothiazide (HYDRODIURIL) 25 MG tablet Take 1 tablet (25  mg total) by mouth daily.  Marland Kitchen lisinopril (ZESTRIL) 20 MG tablet Take 1 tablet (20 mg total) by mouth daily.  . sildenafil (REVATIO) 20 MG tablet Take 1-4 tablets once a day, 30 minutes to 4 hours prior to intercourse. Do not take more than once in 24 hours.  Marland Kitchen testosterone cypionate (DEPOTESTOSTERONE CYPIONATE) 200 MG/ML injection Inject into the muscle every 7 (seven) days.  Marland Kitchen tiZANidine (ZANAFLEX) 4 MG capsule Take 4 mg by mouth 3 (three) times daily as needed for muscle spasms.  . valACYclovir (VALTREX) 500 MG tablet Take 2 tablets x 2 doses as needed for break out   No facility-administered encounter medications on file as of 07/01/2020.

## 2020-07-09 ENCOUNTER — Other Ambulatory Visit: Payer: Self-pay | Admitting: Family Medicine

## 2020-07-09 DIAGNOSIS — N529 Male erectile dysfunction, unspecified: Secondary | ICD-10-CM

## 2020-07-18 ENCOUNTER — Telehealth: Payer: Self-pay

## 2020-07-18 NOTE — Telephone Encounter (Signed)
Pt called the triage line stating he is having severe pain in his shoulder and wanted to know if Dr Patsy Lager was available today to do a cortisone injection or something for the pain because it was extremely bad today. He has not had the MRI, yet. I told him if it was that bad, I would recommend going to one of the orthopedic urgent cares. He will go to [facility].

## 2020-07-18 NOTE — Telephone Encounter (Signed)
Agree with recommendation for him to be seen at ortho urgent care.

## 2020-07-18 NOTE — Telephone Encounter (Signed)
I will be unavailable until Monday

## 2020-07-24 ENCOUNTER — Ambulatory Visit
Admission: RE | Admit: 2020-07-24 | Discharge: 2020-07-24 | Disposition: A | Discharge status: Home / Self Care | Payer: BLUE CROSS/BLUE SHIELD | Source: Ambulatory Visit | Attending: Family Medicine | Admitting: Family Medicine

## 2020-07-24 ENCOUNTER — Other Ambulatory Visit: Payer: Self-pay

## 2020-07-24 DIAGNOSIS — M6281 Muscle weakness (generalized): Secondary | ICD-10-CM

## 2020-07-24 DIAGNOSIS — M25511 Pain in right shoulder: Secondary | ICD-10-CM

## 2020-07-24 DIAGNOSIS — G8929 Other chronic pain: Secondary | ICD-10-CM

## 2020-07-30 ENCOUNTER — Telehealth: Payer: Self-pay | Admitting: *Deleted

## 2020-07-30 DIAGNOSIS — M75101 Unspecified rotator cuff tear or rupture of right shoulder, not specified as traumatic: Secondary | ICD-10-CM

## 2020-07-30 DIAGNOSIS — M75121 Complete rotator cuff tear or rupture of right shoulder, not specified as traumatic: Secondary | ICD-10-CM

## 2020-07-30 DIAGNOSIS — S46811A Strain of other muscles, fascia and tendons at shoulder and upper arm level, right arm, initial encounter: Secondary | ICD-10-CM

## 2020-07-30 NOTE — Telephone Encounter (Signed)
Dr. Patsy Lager - I did not see a referral placed for this patient. It looks like he did not wish to move forward with referral at the time fwhen imaging was completed. I did contact patient and he would lke to move forward with orthopedic surgery referral for his rotator cuff tear.  I have pended a referral to Dr. Ave Filter for you to sign if you would like to proceed!   Let me know if there is anything I need to change. Thanks!

## 2020-07-30 NOTE — Telephone Encounter (Signed)
Pt called because he was suppose to be referred to a "shoulder surgeon" pt said he spoke with someone in our office who told him the appt info would be on mychart. Pt said he checked on mychart and it wasn't on there so he is calling to get info from referral. Pt request CB with referral info

## 2020-07-30 NOTE — Telephone Encounter (Signed)
Can you apologize to [facility]?  I thought I had done this, but apparently I did not do the order in Epic.  Thank-you very much for your help.

## 2020-07-30 NOTE — Telephone Encounter (Signed)
Spoke with patient and advised him of Dr. Cyndie Chime apologies. He was very understanding and states he will be on the look out from Dr. Selina Cooley office to arrange his visit with them. Thanks!

## 2021-01-02 ENCOUNTER — Telehealth: Payer: Self-pay

## 2021-01-02 MED ORDER — HYDROCHLOROTHIAZIDE 25 MG PO TABS: 25.0000 mg | ORAL_TABLET | Freq: Every day | ORAL | 0 refills | Status: DC

## 2021-01-02 MED ORDER — LISINOPRIL 20 MG PO TABS: 20.0000 mg | ORAL_TABLET | Freq: Every day | ORAL | 0 refills | Status: DC

## 2021-01-02 NOTE — Addendum Note (Signed)
Addended by: Karenann Cai on: 01/02/2021 10:54 AM   Modules accepted: Orders

## 2021-01-02 NOTE — Telephone Encounter (Signed)
Pharmacy requests refill on: Hydrochlorothiazide 25 mg   LAST REFILL: 12/27/2019 (Q-90, R-3) LAST OV: 06/26/2020 NEXT OV: Not Scheduled  PHARMACY: [id number] [place], [place]

## 2021-01-02 NOTE — Telephone Encounter (Signed)
Pharmacy requests refill on: Lisinopril 20 mg   LAST REFILL: 12/27/2019 (Q-90, R-3) LAST OV: 06/26/2020 NEXT OV: Not Scheduled  PHARMACY: [id number] [place], [place]

## 2021-04-02 ENCOUNTER — Other Ambulatory Visit: Payer: Self-pay | Admitting: Family Medicine

## 2021-04-04 NOTE — Telephone Encounter (Signed)
Pt called and states that due to insurance, the [facility]isinopril and HCT[facility] [place]

## 2021-04-07 ENCOUNTER — Other Ambulatory Visit: Payer: Self-pay

## 2021-04-07 MED ORDER — LISINOPRIL 20 MG PO TABS: 1.0000 | ORAL_TABLET | Freq: Every day | ORAL | 0 refills | Status: DC

## 2021-04-07 MED ORDER — HYDROCHLOROTHIAZIDE 25 MG PO TABS: 1.0000 | ORAL_TABLET | Freq: Every day | ORAL | 0 refills | Status: DC

## 2021-04-07 NOTE — Telephone Encounter (Signed)
[facility] [place] as requested.

## 2021-06-28 ENCOUNTER — Other Ambulatory Visit: Payer: Self-pay | Admitting: Family

## 2021-07-04 NOTE — Telephone Encounter (Signed)
  Encourage patient to contact the pharmacy for refills or they can request refills through [facility]  LAST APPOINTMENT DATE:  Please schedule appointment if longer than 1 year  NEXT APPOINTMENT DATE: 07/16/21  MEDICATION:  hydrochlorothiazide (HYDRODIURIL) 25 MG tablet  lisinopril (ZESTRIL) 20 MG tablet  Is the patient out of medication? yes  [facility] [place]  Let patient know to contact pharmacy at the end of the day to make sure medication is ready.  Please notify patient to allow 48-72 hours to process  CLINICAL FILLS OUT ALL BELOW:   LAST REFILL:  QTY:  REFILL DATE:    OTHER COMMENTS:    Okay for refill?  Please advise

## 2021-07-04 NOTE — Telephone Encounter (Signed)
LOV 07/01/20 acute with Dr Patsy Lager  Next appointment for [facility] on 07/16/21 with Audria Nine Last refilled by [facility] on 04/07/21 #90 with 0 refill

## 2021-07-16 ENCOUNTER — Other Ambulatory Visit: Payer: Self-pay

## 2021-07-16 ENCOUNTER — Ambulatory Visit: Payer: BLUE CROSS/BLUE SHIELD | Admitting: Nurse Practitioner

## 2021-07-16 ENCOUNTER — Encounter: Payer: Self-pay | Admitting: Nurse Practitioner

## 2021-07-16 VITALS — BP 148/88 | HR 76 | Temp 98.3°F | Resp 14 | Wt 201.0 lb

## 2021-07-16 DIAGNOSIS — Z0000 Encounter for general adult medical examination without abnormal findings: Secondary | ICD-10-CM

## 2021-07-16 DIAGNOSIS — Z23 Encounter for immunization: Secondary | ICD-10-CM | POA: Diagnosis not present

## 2021-07-16 DIAGNOSIS — Z125 Encounter for screening for malignant neoplasm of prostate: Secondary | ICD-10-CM

## 2021-07-16 DIAGNOSIS — I10 Essential (primary) hypertension: Secondary | ICD-10-CM | POA: Diagnosis not present

## 2021-07-16 DIAGNOSIS — N529 Male erectile dysfunction, unspecified: Secondary | ICD-10-CM | POA: Diagnosis not present

## 2021-07-16 DIAGNOSIS — Z1211 Encounter for screening for malignant neoplasm of colon: Secondary | ICD-10-CM | POA: Diagnosis not present

## 2021-07-16 DIAGNOSIS — R351 Nocturia: Secondary | ICD-10-CM | POA: Diagnosis not present

## 2021-07-16 LAB — POCT URINALYSIS DIP (CLINITEK)
Bilirubin, UA: NEGATIVE
Blood, UA: NEGATIVE
Glucose, UA: NEGATIVE mg/dL
Ketones, POC UA: NEGATIVE mg/dL
Leukocytes, UA: NEGATIVE
Nitrite, UA: NEGATIVE
POC PROTEIN,UA: NEGATIVE
Spec Grav, UA: 1.01 (ref 1.010–1.025)
Urobilinogen, UA: 0.2 E.U./dL
pH, UA: 6 (ref 5.0–8.0)

## 2021-07-16 MED ORDER — SILDENAFIL CITRATE 20 MG PO TABS: ORAL_TABLET | ORAL | 1 refills | Status: DC

## 2021-07-16 NOTE — Progress Notes (Signed)
Established Patient Office Visit  Subjective:  Patient ID: Angel Bautista, male    DOB: December 07, 1970  Age: 50 y.o. MRN: [id number]  CC:  Chief Complaint  Patient presents with   Transfer of Care   Hypertension    Would like to discuss coming off HCTZ- has nocturia since starting this medication and he takes medication in the morning and limits his fluid intake in the evening    HPI Angel Bautista presents for Transfer of care for complete physical and follow up of chronic conditions.  Immunizations: -Tetanus: UTD -Influenza: Refused -Covid-19: Refused -Shingles: First dose today -Pneumonia: NA    Diet: Fair diet.  7 times a day Exercise: No regular exercise. Gym about an hour.  Eye exam: Need Dental exam: Completes semi-annually   Colonoscopy: Ordered today. Never had one done PSA: Due, ordered today  Lung Cancer Screening: Former smoker for 15 pack years. Quit approx 10 years ago  HTN: Does not have a blood pressure cuff at home. No symptoms  States that since he started taking HCTZ he Sildenafil: states that he is able to obtain erection but can loose it toward Smoke: stopped smoking approx 10 years   Testosterone: States that he takes it. States last time was 10 years ago. 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   Procedure: TOTAL HIP ARTHROPLASTY ANTERIOR APPROACH;  Surgeon: 07/15/2015, MD;  Location: [facility] ORS;  Service: Orthopedics;  Laterality: Left;   TOTAL HIP ARTHROPLASTY Right 11/24/2016   Procedure: TOTAL HIP ARTHROPLASTY ANTERIOR APPROACH;  Surgeon: 11/26/2016, MD;  Location: [facility] ORS;  Service: Orthopedics;  Laterality: Right;    Family History  Problem Relation Age of Onset    Prostate cancer Mother    Alcohol abuse Father    Hearing loss Father    Hyperlipidemia Father    Hypertension Father    Cancer Sister    Early death Sister    Hypertension Brother    Other Neg Hx     Social History   Socioeconomic History   Marital status: Divorced    Spouse name: Not on file   Number of children: Not on file   Years of education: Not on file   Highest education level: Not on file  Occupational History   Not on file  Tobacco Use   Smoking status: Former    Packs/day: 1.00    Years: 15.00    Pack years: 15.00    Types: Cigarettes   Smokeless tobacco: Former  Substance and Sexual Activity   Alcohol use: Yes    Alcohol/week: 4.0 standard drinks    Types: 4 Cans of beer per week    Comment: daily   Drug use: No   Sexual activity: Not on file  Other Topics Concern   Not on file  Social History Narrative   Not on file   Social Determinants of Health   Financial Resource Strain: Not on file  Food Insecurity: Not on file  Transportation Needs: Not on file  Physical Activity: Not on file  Stress: Not on file  Social Connections: Not on file  Intimate Partner  Violence: Not on file    Outpatient Medications Prior to Visit  Medication Sig Dispense Refill   hydrochlorothiazide (HYDRODIURIL) 25 MG tablet TAKE 1 TABLET (25 MG TOTAL) BY MOUTH DAILY. 90 tablet 0   lisinopril (ZESTRIL) 20 MG tablet TAKE 1 TABLET BY MOUTH EVERY DAY 90 tablet 0   sildenafil (REVATIO) 20 MG tablet TAKE 1 TO 4 TABLETS BY MOUTH ONCE DAILY 30  MINUTES  TO  4  HOURS  PRIOR  TO  INTERCOURSE.  DO  NOT  TAKE  MORE  THAN  ONCE  IN  24  HOURS 20 tablet 0   testosterone cypionate (DEPOTESTOSTERONE CYPIONATE) 200 MG/ML injection Inject into the muscle every 7 (seven) days.     valACYclovir (VALTREX) 500 MG tablet Take 2 tablets x 2 doses as needed for break out 20 tablet 1   diphenhydrAMINE (BENADRYL) 25 MG tablet Take 25 mg by mouth every evening.     tiZANidine (ZANAFLEX) 4 MG  capsule Take 4 mg by mouth 3 (three) times daily as needed for muscle spasms. (Patient not taking: Reported on 07/16/2021)     No facility-administered medications prior to visit.    Allergies  Allergen Reactions   Tramadol Itching   Penicillins Swelling    ROS Review of Systems  Constitutional:  Negative for chills, fatigue and fever.  Respiratory:  Negative for cough and shortness of breath.   Cardiovascular:  Negative for chest pain, palpitations and leg swelling.  Gastrointestinal:  Negative for constipation, diarrhea, nausea and vomiting.  Genitourinary:  Positive for frequency. Negative for dysuria, penile discharge, penile pain, penile swelling and scrotal swelling.       Nocturia 3-4 times a night   Neurological:  Negative for dizziness, weakness, light-headedness and headaches.  Psychiatric/Behavioral:  Negative for hallucinations and suicidal ideas.      Objective:    Physical Exam Vitals and nursing note reviewed.  Constitutional:      Appearance: Normal appearance.  HENT:     Right Ear: Tympanic membrane, ear canal and external ear normal. There is no impacted cerumen.     Left Ear: Tympanic membrane, ear canal and external ear normal. There is no impacted cerumen.     Mouth/Throat:     Mouth: Mucous membranes are moist.     Pharynx: Oropharynx is clear.  Eyes:     Extraocular Movements: Extraocular movements intact.     Pupils: Pupils are equal, round, and reactive to light.  Neck:     Vascular: No carotid bruit.  Cardiovascular:     Rate and Rhythm: Normal rate and regular rhythm.  Pulmonary:     Effort: Pulmonary effort is normal.     Breath sounds: Normal breath sounds.  Abdominal:     General: Bowel sounds are normal.  Musculoskeletal:     Right lower leg: No edema.     Left lower leg: No edema.  Lymphadenopathy:     Cervical: No cervical adenopathy.  Skin:    General: Skin is warm.  Neurological:     Mental Status: He is alert.     Motor: No  weakness.     Gait: Gait normal.  Psychiatric:        Mood and Affect: Mood normal.        Behavior: Behavior normal.        Thought Content: Thought content normal.        Judgment: Judgment normal.    BP (!) 148/88   Pulse 76  Temp 98.3 F (36.8 C)   Resp 14   Wt 201 lb (91.2 kg)   SpO2 97%   BMI 32.44 kg/m  Wt Readings from Last 3 Encounters:  07/16/21 201 lb (91.2 kg)  07/01/20 207 lb 8 oz (94.1 kg)  06/26/20 207 lb (93.9 kg)     Health Maintenance Due  Topic Date Due   HIV Screening  Never done   Hepatitis C Screening  Never done   COLONOSCOPY (Pts 45-65yrs Insurance coverage will need to be confirmed)  Never done   Zoster Vaccines- Shingrix (1 of 2) Never done    There are no preventive care reminders to display for this patient.  No results found for: TSH Lab Results  Component Value Date   WBC 5.3 12/27/2019   HGB 15.7 12/27/2019   HCT 43.8 12/27/2019   MCV 90.0 12/27/2019   PLT 184.0 12/27/2019   Lab Results  Component Value Date   NA 130 (L) 06/26/2020   K 4.5 06/26/2020   CO2 32 06/26/2020   GLUCOSE 104 (H) 06/26/2020   BUN 21 06/26/2020   CREATININE 1.35 06/26/2020   BILITOT 0.8 06/26/2020   ALKPHOS 46 06/26/2020   AST 33 06/26/2020   ALT 35 06/26/2020   PROT 6.7 06/26/2020   ALBUMIN 4.6 06/26/2020   CALCIUM 9.8 06/26/2020   ANIONGAP 6 11/25/2016   GFR 55.73 (L) 06/26/2020   Lab Results  Component Value Date   CHOL 145 06/26/2020   Lab Results  Component Value Date   HDL 35.00 (L) 06/26/2020   Lab Results  Component Value Date   LDLCALC 89 06/26/2020   Lab Results  Component Value Date   TRIG 105.0 06/26/2020   Lab Results  Component Value Date   CHOLHDL 4 06/26/2020   No results found for: HGBA1C    Assessment & Plan:   Problem List Items Addressed This Visit       Cardiovascular and Mediastinum   Essential hypertension    Patient currently maintained on lisinopril 20 mg and HCTZ 25 mg.  Patient's blood pressure  slightly above goal in office.  Continue take lisinopril 20 mg and will take a holiday off the HCTZ to see if that helps with the urinary symptoms.  Did encourage patient to check blood pressure if available.  Pending lab work if prostate does not seem to be the issue will either go back on HCTZ or double up on lisinopril.  Pending lab work continue lisinopril 20 mg      Relevant Medications   sildenafil (REVATIO) 20 MG tablet     Other   Erectile dysfunction    States that the ability to obtain erections sometimes towards the end of intercourse he starts to become flaccid states that the sildenafil does help in that regard tolerated medication well.  Continue sildenafil as prescribed      Relevant Medications   sildenafil (REVATIO) 20 MG tablet   Nocturia    Patient states he has had nocturia ever since he was started on HCTZ.  States he does take in the morning approximately 3-4 AM states that he still gets up 3-4 times nightly.  He believes the culprit is HCTZ.  We did discuss that he can discontinue that medication currently have safe and effective symptoms did inform him more than likely that his prostate related.  Discontinue HCTZ starting tomorrow 07/17/2021.  Pending labs      Relevant Orders   PSA   POCT URINALYSIS DIP (  CLINITEK) (Completed)   Preventative health care - Primary    Discussed appropriate age related preventative interventions exams.  Patient did refuse flu vaccine today.  Will order for colonoscopy.  Patient did receive Shingrix vaccine in office today.      Relevant Orders   CBC   Comprehensive metabolic panel   Hemoglobin A1c   TSH   PSA   Lipid panel   POCT URINALYSIS DIP (CLINITEK) (Completed)   Other Visit Diagnoses     Encounter for prostate cancer screening       Relevant Orders   PSA   Need for shingles vaccine       Relevant Orders   Varicella-zoster vaccine IM (Shingrix) (Completed)   Screening for colon cancer       Relevant Orders    Ambulatory referral to Gastroenterology       No orders of the defined types were placed in this encounter.   Follow-up: Return in about 6 weeks (around 08/27/2021) for nurse visit for BP check.   This visit occurred during the SARS-CoV-2 public health emergency.  Safety protocols were in place, including screening questions prior to the visit, additional usage of staff PPE, and extensive cleaning of exam room while observing appropriate contact time as indicated for disinfecting solutions.   Audria Nine, NP

## 2021-07-16 NOTE — Patient Instructions (Addendum)
Nice to see you today. I want to see you back in 6 weeks for a blood pressure check off the hydrochlorothiazide. We are going to stop taking the HCTZ for now pending labs and I will call you with recommendations afterwards. If you find a way to check your blood pressure once to twice a week that would be great

## 2021-07-16 NOTE — Assessment & Plan Note (Signed)
Patient states he has had nocturia ever since he was started on HCTZ.  States he does take in the morning approximately 3-4 AM states that he still gets up 3-4 times nightly.  He believes the culprit is HCTZ.  We did discuss that he can discontinue that medication currently have safe and effective symptoms did inform him more than likely that his prostate related.  Discontinue HCTZ starting tomorrow 07/17/2021.  Pending labs

## 2021-07-16 NOTE — Assessment & Plan Note (Signed)
Discussed appropriate age related preventative interventions exams.  Patient did refuse flu vaccine today.  Will order for colonoscopy.  Patient did receive Shingrix vaccine in office today.

## 2021-07-16 NOTE — Assessment & Plan Note (Signed)
Patient currently maintained on lisinopril 20 mg and HCTZ 25 mg.  Patient's blood pressure slightly above goal in office.  Continue take lisinopril 20 mg and will take a holiday off the HCTZ to see if that helps with the urinary symptoms.  Did encourage patient to check blood pressure if available.  Pending lab work if prostate does not seem to be the issue will either go back on HCTZ or double up on lisinopril.  Pending lab work continue lisinopril 20 mg

## 2021-07-16 NOTE — Assessment & Plan Note (Signed)
States that the ability to obtain erections sometimes towards the end of intercourse he starts to become flaccid states that the sildenafil does help in that regard tolerated medication well.  Continue sildenafil as prescribed

## 2021-07-17 ENCOUNTER — Other Ambulatory Visit: Payer: Self-pay | Admitting: Nurse Practitioner

## 2021-07-17 DIAGNOSIS — I10 Essential (primary) hypertension: Secondary | ICD-10-CM

## 2021-07-17 LAB — CBC
HCT: 43.2 % (ref 39.0–52.0)
Hemoglobin: 15.4 g/dL (ref 13.0–17.0)
MCHC: 35.7 g/dL (ref 30.0–36.0)
MCV: 87.7 fl (ref 78.0–100.0)
Platelets: 211 10*3/uL (ref 150.0–400.0)
RBC: 4.93 Mil/uL (ref 4.22–5.81)
RDW: 12.7 % (ref 11.5–15.5)
WBC: 4.6 10*3/uL (ref 4.0–10.5)

## 2021-07-17 LAB — COMPREHENSIVE METABOLIC PANEL
ALT: 32 U/L (ref 0–53)
AST: 31 U/L (ref 0–37)
Albumin: 4.6 g/dL (ref 3.5–5.2)
Alkaline Phosphatase: 56 U/L (ref 39–117)
BUN: 20 mg/dL (ref 6–23)
CO2: 32 mEq/L (ref 19–32)
Calcium: 9.8 mg/dL (ref 8.4–10.5)
Chloride: 90 mEq/L — ABNORMAL LOW (ref 96–112)
Creatinine, Ser: 1.18 mg/dL (ref 0.40–1.50)
GFR: 71.21 mL/min (ref >60.00)
Glucose, Bld: 93 mg/dL (ref 70–99)
Potassium: 4.3 mEq/L (ref 3.5–5.1)
Sodium: 129 mEq/L — ABNORMAL LOW (ref 135–145)
Total Bilirubin: 0.8 mg/dL (ref 0.2–1.2)
Total Protein: 6.6 g/dL (ref 6.0–8.3)

## 2021-07-17 LAB — LIPID PANEL
Cholesterol: 135 mg/dL (ref 0–200)
HDL: 32.6 mg/dL — ABNORMAL LOW (ref >39.00)
LDL Cholesterol: 83 mg/dL (ref 0–99)
NonHDL: 102.35
Total CHOL/HDL Ratio: 4
Triglycerides: 96 mg/dL (ref 0.0–149.0)
VLDL: 19.2 mg/dL (ref 0.0–40.0)

## 2021-07-17 LAB — TSH: TSH: 1.01 u[IU]/mL (ref 0.35–5.50)

## 2021-07-17 LAB — HEMOGLOBIN A1C: Hgb A1c MFr Bld: 5 % (ref 4.6–6.5)

## 2021-07-17 LAB — PSA: PSA: 3.38 ng/mL (ref 0.10–4.00)

## 2021-07-17 MED ORDER — LISINOPRIL 40 MG PO TABS
40.0000 mg | ORAL_TABLET | Freq: Every day | ORAL | 1 refills | Status: DC
Start: 2021-07-17 — End: 2021-08-26

## 2021-08-22 ENCOUNTER — Telehealth: Payer: Self-pay | Admitting: Family

## 2021-08-25 NOTE — Telephone Encounter (Signed)
I see patient or pharmacy requested lisinopril 20mg . We had switched him to 40mg . He has  a nurse visit at the first of the month for a bp recheck. Can we call and verify with the patient that he is taking the 40mg  ones and see what his BP has been running if he has checked it at all?  Thanks, 

## 2021-08-25 NOTE — Telephone Encounter (Signed)
Pt called back he is talking the 40mg  and he hasn't checked his BP since his last Office Visit  Pt got rude with me and said that this is ridiculous that he has to go through this every time to get a refill

## 2021-08-25 NOTE — Telephone Encounter (Signed)
Left message for patient to call back  

## 2021-08-26 ENCOUNTER — Other Ambulatory Visit: Payer: Self-pay | Admitting: Nurse Practitioner

## 2021-08-26 DIAGNOSIS — I10 Essential (primary) hypertension: Secondary | ICD-10-CM

## 2021-08-26 MED ORDER — LISINOPRIL 40 MG PO TABS: 40.0000 mg | ORAL_TABLET | Freq: Every day | ORAL | 1 refills | Status: DC

## 2021-08-26 NOTE — Telephone Encounter (Signed)
Pt has called again wondering about the refill. States his BP has only been bad due to not taking the medication

## 2021-08-26 NOTE — Addendum Note (Signed)
Addended by: Consuella Lose on: 08/26/2021 03:28 PM   Modules accepted: Orders

## 2021-08-26 NOTE — Telephone Encounter (Signed)
Spoke with patient. Patient was frustrated because he is needing his b/p medication and it is not been refilled and his b/p is getting high now. I apologized to the patient about this and misunderstanding, advised that [facility] mg [facility]. [facility], [facility] and [facility]. Matt Cable re [facility] [place], [facility] and made sure [facility] would be filled today. Patient was advised I would make sure this is taking care of for him and to make sure he can pick this up today. Patient expressed thanks and I advised patient to call me back if there are any more issues. Audria Nine is aware of everything.

## 2021-09-02 ENCOUNTER — Ambulatory Visit: Payer: BLUE CROSS/BLUE SHIELD | Admitting: Nurse Practitioner

## 2021-09-02 ENCOUNTER — Other Ambulatory Visit: Payer: Self-pay

## 2021-09-02 ENCOUNTER — Ambulatory Visit: Payer: BLUE CROSS/BLUE SHIELD | Admitting: *Deleted

## 2021-09-02 VITALS — BP 160/84 | HR 75

## 2021-09-02 DIAGNOSIS — I10 Essential (primary) hypertension: Secondary | ICD-10-CM

## 2021-09-02 NOTE — Progress Notes (Signed)
Per Audria Nine encounter order on 09/02/2021 patient presents today for a nurse visit blood pressure check for ongoing follow up and management.  Vital Sign Readings today BP 160/84 Pulse: 75 SpO2 96%  Patient is to continue current regimen until reviewed by Audria Nine.

## 2021-09-03 ENCOUNTER — Telehealth: Payer: Self-pay | Admitting: Nurse Practitioner

## 2021-09-03 NOTE — Telephone Encounter (Signed)
Pt returning call

## 2021-09-03 NOTE — Telephone Encounter (Signed)
Spoke with patient. Patient has not been checking his b/p but will start to. Patient was advised per [facility] to check b/p 3 times a week for the next 2 weeks and call me back with dates and readings. Patient verbalized understanding and will call me back.  Patient states he is doing much better since stopping HCTZ and maybe gets up once at night at times to use the bathroom.

## 2021-09-03 NOTE — Telephone Encounter (Signed)
Left detailed message for the patient-ok per DPR on file. I spoke with patient last on 08/26/21 and he was taking 40 mg daily. I asked patient to call me back to see if he is able or have been checking his b/p and about urination.   Patient was out of the medication for a few days around 08/26/21 when I talked to him then because he was waiting on refill approval from [place]. Will let Matt know this part in case that changes his treatment plan.

## 2021-09-03 NOTE — Telephone Encounter (Signed)
I see he has had a nurse visit and his BP is elevated. Can we call and see what his at home BP readings have been? Was he able to get the script for the 40mg  of the lisinopril?  Also make sure that since we stopped the HCTZ that his urinating at night is still improved

## 2021-11-18 ENCOUNTER — Other Ambulatory Visit: Payer: Self-pay

## 2021-11-18 ENCOUNTER — Ambulatory Visit (INDEPENDENT_AMBULATORY_CARE_PROVIDER_SITE_OTHER): Payer: BLUE CROSS/BLUE SHIELD

## 2021-11-18 DIAGNOSIS — Z23 Encounter for immunization: Secondary | ICD-10-CM

## 2022-02-18 ENCOUNTER — Other Ambulatory Visit: Payer: Self-pay | Admitting: Nurse Practitioner

## 2022-02-18 DIAGNOSIS — N529 Male erectile dysfunction, unspecified: Secondary | ICD-10-CM

## 2022-02-18 DIAGNOSIS — I10 Essential (primary) hypertension: Secondary | ICD-10-CM

## 2022-02-18 NOTE — Telephone Encounter (Signed)
Left detailed message asking patient to call back to scheduled OV with Audria Nine to follow up B/P ?

## 2022-02-20 MED ORDER — SILDENAFIL CITRATE 20 MG PO TABS: ORAL_TABLET | ORAL | 1 refills | Status: DC

## 2022-02-20 NOTE — Telephone Encounter (Signed)
Spoke with patient. Appointment made for 02/25/22 for follow up. ? ?[facility]. ? ?Also, patient states when Silden[facility]il was [facility] to [facility] in September 2022 he was not able to pick that up due to the cost $200. He can get this much cheaper from [facility]. Please re send to [facility] and [facility] RX for Sildenafil they had on file with them. ? ?I have pulled in the pharmacies and associated the medications with them. ? ? ? ?

## 2022-02-25 ENCOUNTER — Ambulatory Visit: Payer: BLUE CROSS/BLUE SHIELD | Admitting: Nurse Practitioner

## 2022-02-25 VITALS — BP 162/90 | HR 79 | Temp 97.6°F | Ht 70.0 in | Wt 199.8 lb

## 2022-02-25 DIAGNOSIS — I10 Essential (primary) hypertension: Secondary | ICD-10-CM | POA: Diagnosis not present

## 2022-02-25 DIAGNOSIS — A6000 Herpesviral infection of urogenital system, unspecified: Secondary | ICD-10-CM | POA: Diagnosis not present

## 2022-02-25 DIAGNOSIS — Z79890 Hormone replacement therapy (postmenopausal): Secondary | ICD-10-CM

## 2022-02-25 MED ORDER — AMLODIPINE BESYLATE 5 MG PO TABS: 5.0000 mg | ORAL_TABLET | Freq: Every day | ORAL | 0 refills | Status: DC

## 2022-02-25 MED ORDER — VALACYCLOVIR HCL 500 MG PO TABS: ORAL_TABLET | ORAL | 1 refills | Status: DC

## 2022-02-25 NOTE — Progress Notes (Signed)
? ?Established Patient Office Visit ? ?Subjective   ?Patient ID: Angel Bautista, male    DOB: 26-Dec-1970  Age: 51 y.o. MRN: [id number] ? ?Chief Complaint  ?Patient presents with  ? Blood Pressure Check  ? ? ?HPI ? ?Patient is back in for hypertension recheck  ? ?Patient been using testosterone that is not prescribed states higher dosing and lower dosing in the summer. States that  when he blasts he will use up to 400 mg injectable weekly.  Cruising is when he is doing lower doses.  Patient currently prescribed lisinopril 40 mg once daily and states he is taking medication as prescribed has not been checking blood pressure at home.  Has not had any symptoms while on medication.  States at 1 point he did run out of medication for approximately 3 days and on day 3 he did feel bad and have headaches. ? ? ? ? ?Review of Systems  ?Constitutional:  Negative for chills and fever.  ?Respiratory:  Negative for shortness of breath.   ?Cardiovascular:  Negative for chest pain, palpitations and leg swelling.  ?Neurological:  Negative for dizziness and headaches.  ? ?  ?Objective:  ?  ? ?BP (!) 162/90 (BP Location: Left Arm, Patient Position: Sitting, Cuff Size: Normal)   Pulse 79   Temp 97.6 ?F (36.4 ?C) (Temporal)   Ht 5\' 10"  (1.778 m)   Wt 199 lb 12.8 oz (90.6 kg)   SpO2 96%   BMI 28.67 kg/m?  ?BP Readings from Last 3 Encounters:  ?02/25/22 (!) 162/90  ?09/02/21 (!) 160/84  ?07/16/21 (!) 148/88  ? ?  ? ?Physical Exam ?Vitals and nursing note reviewed.  ?Constitutional:   ?   Appearance: Normal appearance.  ?Cardiovascular:  ?   Rate and Rhythm: Normal rate and regular rhythm.  ?   Heart sounds: Normal heart sounds.  ?Pulmonary:  ?   Effort: Pulmonary effort is normal.  ?   Breath sounds: Normal breath sounds.  ?Musculoskeletal:  ?   Right lower leg: No edema.  ?   Left lower leg: No edema.  ?Neurological:  ?   Mental Status: He is alert.  ? ? ? ?No results found for any visits on 02/25/22. ? ? ? ?The 10-year ASCVD risk  score (Arnett DK, et al., 2019) is: 6.8% ? ?  ?Assessment & Plan:  ? ?Problem List Items Addressed This Visit   ? ?  ? Cardiovascular and Mediastinum  ? Essential hypertension - Primary  ?  Patient currently maintained on lisinopril 40 mg.  Patient is not at goal.  Will continue lisinopril 40 mg.  Did offer to add on HCTZ but patient has been on it in the past and did not have good results states he urinated all the time he was in the night.  We will start amlodipine 5 mg patient will follow-up in 1 month for blood pressure recheck. ? ?  ?  ? Relevant Medications  ? amLODipine (NORVASC) 5 MG tablet  ? Other Relevant Orders  ? Microalbumin / creatinine urine ratio  ? CBC  ? Comprehensive metabolic panel  ?  ? Genitourinary  ? Genital herpes simplex  ? Relevant Medications  ? valACYclovir (VALTREX) 500 MG tablet  ?  ? Other  ? Long-term current use of testosterone cypionate  ?  Patient currently obtaining testosterone to use for bodybuilding.  States he can use up to 400 mg a week when he is bulking up.  I did review common  side effects and risk factors of using testosterone under supervised setting such as hypertension, blood clots.  Did discourage patient using the testosterone injections.  Did offer to check patient's testosterone to see what his level is even though it was later in the day.  Patient politely declined states he knows his testosterone is above normal limits. ? ?  ?  ? ? ?Return in about 4 weeks (around 03/25/2022) for BP recheck.  ? ? ?Audria Nine, NP ? ?

## 2022-02-25 NOTE — Assessment & Plan Note (Signed)
Patient currently maintained on lisinopril 40 mg.  Patient is not at goal.  Will continue lisinopril 40 mg.  Did offer to add on HCTZ but patient has been on it in the past and did not have good results states he urinated all the time he was in the night.  We will start amlodipine 5 mg patient will follow-up in 1 month for blood pressure recheck. ?

## 2022-02-25 NOTE — Patient Instructions (Signed)
Nice to see you today ?Follow up with me in 1 month, sooner if you need me ?I will be in touch with the lab results ?

## 2022-02-25 NOTE — Assessment & Plan Note (Signed)
Patient currently obtaining testosterone to use for bodybuilding.  States he can use up to 400 mg a week when he is bulking up.  I did review common side effects and risk factors of using testosterone under supervised setting such as hypertension, blood clots.  Did discourage patient using the testosterone injections.  Did offer to check patient's testosterone to see what his level is even though it was later in the day.  Patient politely declined states he knows his testosterone is above normal limits. ?

## 2022-02-26 LAB — MICROALBUMIN / CREATININE URINE RATIO
Creatinine,U: 34.6 mg/dL
Microalb Creat Ratio: 5.2 mg/g (ref 0.0–30.0)
Microalb, Ur: 1.8 mg/dL (ref 0.0–1.9)

## 2022-02-26 LAB — COMPREHENSIVE METABOLIC PANEL
ALT: 37 U/L (ref 0–53)
AST: 52 U/L — ABNORMAL HIGH (ref 0–37)
Albumin: 4.8 g/dL (ref 3.5–5.2)
Alkaline Phosphatase: 63 U/L (ref 39–117)
BUN: 26 mg/dL — ABNORMAL HIGH (ref 6–23)
CO2: 26 mEq/L (ref 19–32)
Calcium: 9.5 mg/dL (ref 8.4–10.5)
Chloride: 95 mEq/L — ABNORMAL LOW (ref 96–112)
Creatinine, Ser: 1.17 mg/dL (ref 0.40–1.50)
GFR: 71.63 mL/min (ref >60.00)
Glucose, Bld: 91 mg/dL (ref 70–99)
Potassium: 4.4 mEq/L (ref 3.5–5.1)
Sodium: 130 mEq/L — ABNORMAL LOW (ref 135–145)
Total Bilirubin: 1 mg/dL (ref 0.2–1.2)
Total Protein: 6.9 g/dL (ref 6.0–8.3)

## 2022-02-26 LAB — CBC
HCT: 45 % (ref 39.0–52.0)
Hemoglobin: 15.9 g/dL (ref 13.0–17.0)
MCHC: 35.4 g/dL (ref 30.0–36.0)
MCV: 88.9 fl (ref 78.0–100.0)
Platelets: 195 10*3/uL (ref 150.0–400.0)
RBC: 5.07 Mil/uL (ref 4.22–5.81)
RDW: 13.1 % (ref 11.5–15.5)
WBC: 5.3 10*3/uL (ref 4.0–10.5)

## 2022-04-27 ENCOUNTER — Other Ambulatory Visit: Payer: Self-pay | Admitting: Nurse Practitioner

## 2022-04-27 DIAGNOSIS — N529 Male erectile dysfunction, unspecified: Secondary | ICD-10-CM

## 2022-06-16 ENCOUNTER — Other Ambulatory Visit: Payer: Self-pay | Admitting: Nurse Practitioner

## 2022-06-16 DIAGNOSIS — N529 Male erectile dysfunction, unspecified: Secondary | ICD-10-CM

## 2022-08-15 ENCOUNTER — Other Ambulatory Visit: Payer: Self-pay | Admitting: Nurse Practitioner

## 2022-08-15 DIAGNOSIS — I10 Essential (primary) hypertension: Secondary | ICD-10-CM

## 2022-08-17 NOTE — Telephone Encounter (Signed)
LVM for patient to call back and schedule an OV.  

## 2022-08-17 NOTE — Telephone Encounter (Signed)
Patient was scheduled for blood pressure recheck on 08/24/2022 at 3:20.

## 2022-08-24 ENCOUNTER — Ambulatory Visit: Payer: BLUE CROSS/BLUE SHIELD | Admitting: Nurse Practitioner

## 2022-08-24 ENCOUNTER — Encounter: Payer: Self-pay | Admitting: Nurse Practitioner

## 2022-08-24 DIAGNOSIS — N529 Male erectile dysfunction, unspecified: Secondary | ICD-10-CM | POA: Diagnosis not present

## 2022-08-24 DIAGNOSIS — I10 Essential (primary) hypertension: Secondary | ICD-10-CM | POA: Diagnosis not present

## 2022-08-24 MED ORDER — AMLODIPINE BESYLATE 5 MG PO TABS: 5.0000 mg | ORAL_TABLET | Freq: Every day | ORAL | 1 refills | Status: DC

## 2022-08-24 MED ORDER — SILDENAFIL CITRATE 20 MG PO TABS: ORAL_TABLET | ORAL | 2 refills | Status: DC

## 2022-08-24 NOTE — Assessment & Plan Note (Signed)
Patient requesting refill sildenafil.  Refill provided

## 2022-08-24 NOTE — Assessment & Plan Note (Signed)
Patient currently maintained on lisinopril 40.  We did start him on amlodipine 5 mg the patient medication did not follow-up.  We will start patient back on amlodipine 5 mg along with his 40 mg lisinopril.  Stressed the importance of following up in 1 months to get more medication.  Patient acknowledged to discuss the health effects of having uncontrolled blood pressure for .  Time

## 2022-08-24 NOTE — Progress Notes (Addendum)
   Established Patient Office Visit  Subjective   Patient ID: Angel Bautista, male    DOB: 18-Jun-1971  Age: 51 y.o. MRN: [id number]  Chief Complaint  Patient presents with   Hypertension    Follow up-not checking b/p at home.      HTN: States  that he tolerated the blood pressure. States that when his BP is high he will have a pressure feeling and the vein on his left forehead will pop out. Lisinopril daily with out side effects.   Of note patient is still doing TRT but has decreased his dose dramatically.  States he is doing about 200 mg every other week.  We again discussed in office the side effects and risk continuing TRT.     Review of Systems  Constitutional:  Negative for chills and fever.  Eyes:  Negative for blurred vision and double vision.  Respiratory:  Negative for shortness of breath.   Cardiovascular:  Negative for chest pain.  Neurological:  Negative for headaches.      Objective:     BP (!) 170/90   Pulse 83   Temp (!) 97.3 F (36.3 C)   Resp 12   Ht 5\' 10"  (1.778 m)   Wt 196 lb (88.9 kg)   SpO2 97%   BMI 28.12 kg/m    Physical Exam Vitals and nursing note reviewed.  Constitutional:      Appearance: Normal appearance.  Cardiovascular:     Rate and Rhythm: Normal rate and regular rhythm.     Heart sounds: Normal heart sounds.  Pulmonary:     Effort: Pulmonary effort is normal.     Breath sounds: Normal breath sounds.  Neurological:     Mental Status: He is alert.      No results found for any visits on 08/24/22.    The 10-year ASCVD risk score (Arnett DK, et al., 2019) is: 8.2%    Assessment & Plan:   Problem List Items Addressed This Visit       Cardiovascular and Mediastinum   Essential hypertension    Patient currently maintained on lisinopril 40.  We did start him on amlodipine 5 mg the patient medication did not follow-up.  We will start patient back on amlodipine 5 mg along with his 40 mg lisinopril.  Stressed the  importance of following up in 1 months to get more medication.  Patient acknowledged to discuss the health effects of having uncontrolled blood pressure for .  Time      Relevant Medications   amLODipine (NORVASC) 5 MG tablet   sildenafil (REVATIO) 20 MG tablet     Other   Erectile dysfunction    Patient requesting refill sildenafil.  Refill provided      Relevant Medications   sildenafil (REVATIO) 20 MG tablet    Return in about 4 weeks (around 09/21/2022) for CPE and Labs.    Romilda Garret, NP

## 2022-08-24 NOTE — Patient Instructions (Signed)
Nice to see you today I want to see you in a month for your physical and lab work We will also recheck that blood pressure Follow up with me sooner if you need me

## 2022-09-15 ENCOUNTER — Other Ambulatory Visit: Payer: Self-pay | Admitting: Nurse Practitioner

## 2022-09-15 DIAGNOSIS — I10 Essential (primary) hypertension: Secondary | ICD-10-CM

## 2022-10-15 ENCOUNTER — Ambulatory Visit (INDEPENDENT_AMBULATORY_CARE_PROVIDER_SITE_OTHER): Payer: BLUE CROSS/BLUE SHIELD | Admitting: Nurse Practitioner

## 2022-10-15 ENCOUNTER — Encounter: Payer: Self-pay | Admitting: Nurse Practitioner

## 2022-10-15 VITALS — BP 150/78 | HR 75 | Temp 98.1°F | Ht 70.0 in | Wt 196.0 lb

## 2022-10-15 DIAGNOSIS — I10 Essential (primary) hypertension: Secondary | ICD-10-CM | POA: Diagnosis not present

## 2022-10-15 DIAGNOSIS — Z0000 Encounter for general adult medical examination without abnormal findings: Secondary | ICD-10-CM

## 2022-10-15 DIAGNOSIS — Z1211 Encounter for screening for malignant neoplasm of colon: Secondary | ICD-10-CM | POA: Diagnosis not present

## 2022-10-15 DIAGNOSIS — Z125 Encounter for screening for malignant neoplasm of prostate: Secondary | ICD-10-CM

## 2022-10-15 MED ORDER — AMLODIPINE BESYLATE 10 MG PO TABS: 10.0000 mg | ORAL_TABLET | Freq: Every day | ORAL | 1 refills | Status: DC

## 2022-10-15 NOTE — Progress Notes (Signed)
Established Patient Office Visit  Subjective   Patient ID: Angel Bautista, male    DOB: [birth year]  Age: 51 y.o. MRN: [id number]  Chief Complaint  Patient presents with   Annual Exam    fasting    HPI  for complete physical and follow up of chronic conditions.  HTN: Checked it once at [employer]. It was 176/88 pulse was 72. Tolerating the medications well.   TRT: states that is every 2 weeks that is 200mg  each injection    Immunizations: -Tetanus:2021 -Influenza: reufsed -Shingles:up to date -Pneumonia: Too young -covid: refused  -HPV: aged out  Diet: Fair diet. 7-8 meals a day. Coffee x2 water and 2 beers Exercise:  5 days a week at the gym 1-1.5 hours  Eye exam: PRN   Dental exam: Completes semi-annually   Colonoscopy: Ambulatory referral to [facility]  Lung Cancer Screening: N/A Dexa: N/A  PSA: Due  Sleep: 730-8 and get up at 3 am. Does feel rested. Told he snores.     Review of Systems  Constitutional:  Negative for chills and fever.  Eyes:  Negative for blurred vision and double vision.  Respiratory:  Negative for shortness of breath.   Cardiovascular:  Negative for chest pain.  Gastrointestinal:  Negative for abdominal pain, blood in stool, constipation, diarrhea, nausea and vomiting.  Genitourinary:  Negative for dysuria and hematuria.  Neurological:  Negative for headaches.  Psychiatric/Behavioral:  Negative for hallucinations and suicidal ideas.       Objective:     BP (!) 150/78   Pulse 75   Temp 98.1 F (36.7 C) (Temporal)   Ht 5\' 10"  (1.778 m)   Wt 196 lb (88.9 kg)   SpO2 98%   BMI 28.12 kg/m  BP Readings from Last 3 Encounters:  10/15/22 (!) 150/78  08/24/22 (!) 170/90  02/25/22 (!) 162/90   Wt Readings from Last 3 Encounters:  10/15/22 196 lb (88.9 kg)  08/24/22 196 lb (88.9 kg)  02/25/22 199 lb 12.8 oz (90.6 kg)      Physical Exam Constitutional:      Appearance: Normal appearance.  HENT:     Right Ear: Tympanic  membrane, ear canal and external ear normal.     Left Ear: Tympanic membrane, ear canal and external ear normal.     Mouth/Throat:     Mouth: Mucous membranes are moist.  Eyes:     Extraocular Movements: Extraocular movements intact.     Pupils: Pupils are equal, round, and reactive to light.  Cardiovascular:     Rate and Rhythm: Normal rate and regular rhythm.     Heart sounds: Normal heart sounds.  Pulmonary:     Effort: Pulmonary effort is normal.     Breath sounds: Normal breath sounds.  Abdominal:     General: Bowel sounds are normal. There is no distension.     Palpations: There is no mass.     Tenderness: There is no abdominal tenderness.     Hernia: No hernia is present.  Genitourinary:    Comments: Deferred  Musculoskeletal:     Right lower leg: No edema.     Left lower leg: No edema.  Lymphadenopathy:     Cervical: No cervical adenopathy.  Skin:    General: Skin is warm.  Neurological:     General: No focal deficit present.     Mental Status: He is alert.     Deep Tendon Reflexes:     Reflex Scores:  Bicep reflexes are 2+ on the right side and 2+ on the left side.      Patellar reflexes are 1+ on the right side and 2+ on the left side.    Comments: Bilateral upper and lower extremity strength 5/5      No results found for any visits on 10/15/22.    The 10-year ASCVD risk score (Arnett DK, et al., 2019) is: 6.6%    Assessment & Plan:   Problem List Items Addressed This Visit       Cardiovascular and Mediastinum   Essential hypertension    Only maintained on lisinopril 40 mg amlodipine 5 mg.  Blood pressure still above goal we will titrate amlodipine from 5 mg to 10 mg.  Patient is able to take two 5 mg amlodipine tablets until finished supply then updated prescription sent to pharmacy.      Relevant Medications   amLODipine (NORVASC) 10 MG tablet   Other Relevant Orders   CBC   Comprehensive metabolic panel   Lipid panel     Other    Preventative health care - Primary    Discussed age-appropriate immunizations and screening exams.  Patient is due for PSA and colonoscopy.  Ambulatory referral placed for colonoscopy.  Patient was given information at dismissal about age-related preventative healthcare maintenance and anticipatory guidance.      Relevant Orders   CBC   Comprehensive metabolic panel   Lipid panel   Other Visit Diagnoses     Screening for prostate cancer       Relevant Orders   PSA   Screening for colon cancer       Relevant Orders   Ambulatory referral to Gastroenterology       Return in about 3 months (around 01/14/2023) for Bp recheck .    Audria Nine, NP

## 2022-10-15 NOTE — Assessment & Plan Note (Signed)
Only maintained on lisinopril 40 mg amlodipine 5 mg.  Blood pressure still above goal we will titrate amlodipine from 5 mg to 10 mg.  Patient is able to take two 5 mg amlodipine tablets until finished supply then updated prescription sent to pharmacy.

## 2022-10-15 NOTE — Assessment & Plan Note (Signed)
Discussed age-appropriate immunizations and screening exams.  Patient is due for PSA and colonoscopy.  Ambulatory referral placed for colonoscopy.  Patient was given information at dismissal about age-related preventative healthcare maintenance and anticipatory guidance.

## 2022-10-15 NOTE — Patient Instructions (Signed)
Nice to see you today I will be in touch with the labs You are going to get you to take 2 of the amlodipine 5mg  tablets until you finish what you get and then I will send in 10mg  tablets to take one a day I want to see you in 3 months, sooner if you need me

## 2022-10-16 LAB — COMPREHENSIVE METABOLIC PANEL
ALT: 42 U/L (ref 0–53)
AST: 37 U/L (ref 0–37)
Albumin: 4.8 g/dL (ref 3.5–5.2)
Alkaline Phosphatase: 52 U/L (ref 39–117)
BUN: 22 mg/dL (ref 6–23)
CO2: 29 mEq/L (ref 19–32)
Calcium: 9.8 mg/dL (ref 8.4–10.5)
Chloride: 97 mEq/L (ref 96–112)
Creatinine, Ser: 1.13 mg/dL (ref 0.40–1.50)
GFR: 74.35 mL/min (ref >60.00)
Glucose, Bld: 93 mg/dL (ref 70–99)
Potassium: 4.8 mEq/L (ref 3.5–5.1)
Sodium: 133 mEq/L — ABNORMAL LOW (ref 135–145)
Total Bilirubin: 0.8 mg/dL (ref 0.2–1.2)
Total Protein: 6.8 g/dL (ref 6.0–8.3)

## 2022-10-16 LAB — CBC
HCT: 44.2 % (ref 39.0–52.0)
Hemoglobin: 15.6 g/dL (ref 13.0–17.0)
MCHC: 35.3 g/dL (ref 30.0–36.0)
MCV: 88.1 fl (ref 78.0–100.0)
Platelets: 217 10*3/uL (ref 150.0–400.0)
RBC: 5.02 Mil/uL (ref 4.22–5.81)
RDW: 12.7 % (ref 11.5–15.5)
WBC: 5.3 10*3/uL (ref 4.0–10.5)

## 2022-10-16 LAB — LIPID PANEL
Cholesterol: 126 mg/dL (ref 0–200)
HDL: 35.2 mg/dL — ABNORMAL LOW (ref >39.00)
LDL Cholesterol: 77 mg/dL (ref 0–99)
NonHDL: 90.7
Total CHOL/HDL Ratio: 4
Triglycerides: 68 mg/dL (ref 0.0–149.0)
VLDL: 13.6 mg/dL (ref 0.0–40.0)

## 2022-10-16 LAB — PSA: PSA: 3.79 ng/mL (ref 0.10–4.00)

## 2022-10-20 ENCOUNTER — Telehealth: Payer: Self-pay

## 2022-10-20 ENCOUNTER — Other Ambulatory Visit: Payer: Self-pay

## 2022-10-20 DIAGNOSIS — Z1211 Encounter for screening for malignant neoplasm of colon: Secondary | ICD-10-CM

## 2022-10-20 MED ORDER — NA SULFATE-K SULFATE-MG SULF 17.5-3.13-1.6 GM/177ML PO SOLN: 354.0000 mL | Freq: Once | ORAL | 0 refills | Status: AC

## 2022-10-20 NOTE — Telephone Encounter (Signed)
Gastroenterology Pre-Procedure Review  Request Date: 11/27/2022 Requesting Physician: Dr. Tobi Bastos   PATIENT REVIEW QUESTIONS: The patient responded to the following health history questions as indicated:    1. Are you having any GI issues? no 2. Do you have a personal history of Polyps? no 3. Do you have a family history of Colon Cancer or Polyps?  Sister had colon polyp  4. Diabetes Mellitus? no 5. Joint replacements in the past 12 months?no 6. Major health problems in the past 3 months?no 7. Any artificial heart valves, MVP, or defibrillator?no    MEDICATIONS & ALLERGIES:    Patient reports the following regarding taking any anticoagulation/antiplatelet therapy:   Plavix, Coumadin, Eliquis, Xarelto, Lovenox, Pradaxa, Brilinta, or Effient? no Aspirin? no  Patient confirms/reports the following medications:  Current Outpatient Medications  Medication Sig Dispense Refill   amLODipine (NORVASC) 10 MG tablet Take 1 tablet (10 mg total) by mouth daily. 90 tablet 1   lisinopril (ZESTRIL) 40 MG tablet TAKE 1 TABLET BY MOUTH EVERY DAY 90 tablet 1   sildenafil (REVATIO) 20 MG tablet TAKE ONE TO FOUR TABLETS BY MOUTH ONCE DAILY. TAKE 30 MINUTES TO FOUR HOURS PRIOR TO INTERCOURSE. DO NOT TAKE MORE THAN ONCE IN 24 HOURS 20 tablet 2   valACYclovir (VALTREX) 500 MG tablet Take 2 tablets x 2 doses as needed for break out 20 tablet 1   No current facility-administered medications for this visit.    Patient confirms/reports the following allergies:  Allergies  Allergen Reactions   Tramadol Itching   Penicillins Swelling    No orders of the defined types were placed in this encounter.   AUTHORIZATION INFORMATION Primary Insurance: 1D#: Group #:  Secondary Insurance: 1D#: Group #:  SCHEDULE INFORMATION: Date:  Time: Location:

## 2022-11-26 ENCOUNTER — Encounter: Payer: Self-pay | Admitting: Gastroenterology

## 2022-11-27 ENCOUNTER — Ambulatory Visit
Admission: RE | Admit: 2022-11-27 | Discharge: 2022-11-27 | Disposition: A | Discharge status: Home / Self Care | Payer: BLUE CROSS/BLUE SHIELD | Attending: Gastroenterology | Admitting: Gastroenterology

## 2022-11-27 ENCOUNTER — Ambulatory Visit: Payer: BLUE CROSS/BLUE SHIELD | Admitting: Registered Nurse

## 2022-11-27 ENCOUNTER — Encounter: Payer: Self-pay | Admitting: Gastroenterology

## 2022-11-27 ENCOUNTER — Other Ambulatory Visit: Payer: Self-pay

## 2022-11-27 ENCOUNTER — Encounter
Admission: RE | Disposition: A | Discharge status: Home / Self Care | Payer: Self-pay | Source: Home / Self Care | Attending: Gastroenterology

## 2022-11-27 DIAGNOSIS — Z87891 Personal history of nicotine dependence: Secondary | ICD-10-CM | POA: Diagnosis not present

## 2022-11-27 DIAGNOSIS — I10 Essential (primary) hypertension: Secondary | ICD-10-CM | POA: Insufficient documentation

## 2022-11-27 DIAGNOSIS — K640 First degree hemorrhoids: Secondary | ICD-10-CM | POA: Insufficient documentation

## 2022-11-27 DIAGNOSIS — Z83719 Family history of colon polyps, unspecified: Secondary | ICD-10-CM | POA: Diagnosis not present

## 2022-11-27 DIAGNOSIS — Z1211 Encounter for screening for malignant neoplasm of colon: Secondary | ICD-10-CM | POA: Diagnosis present

## 2022-11-27 DIAGNOSIS — Z96643 Presence of artificial hip joint, bilateral: Secondary | ICD-10-CM | POA: Diagnosis not present

## 2022-11-27 DIAGNOSIS — K5730 Diverticulosis of large intestine without perforation or abscess without bleeding: Secondary | ICD-10-CM | POA: Diagnosis not present

## 2022-11-27 DIAGNOSIS — Z79899 Other long term (current) drug therapy: Secondary | ICD-10-CM | POA: Insufficient documentation

## 2022-11-27 HISTORY — PX: COLONOSCOPY WITH PROPOFOL: SHX5780

## 2022-11-27 SURGERY — COLONOSCOPY WITH PROPOFOL
Anesthesia: General

## 2022-11-27 MED ORDER — EPHEDRINE SULFATE (PRESSORS) 50 MG/ML IJ SOLN
INTRAMUSCULAR | Status: DC | PRN
  Administered 2022-11-27: 7.5 mg via INTRAVENOUS

## 2022-11-27 MED ORDER — SIMETHICONE 40 MG/0.6ML PO SUSP
ORAL | Status: DC | PRN
  Administered 2022-11-27: 60 mL

## 2022-11-27 MED ORDER — DEXMEDETOMIDINE HCL 200 MCG/2ML IV SOLN
INTRAVENOUS | Status: DC | PRN
  Administered 2022-11-27: 8 ug via INTRAVENOUS

## 2022-11-27 MED ORDER — PROPOFOL 10 MG/ML IV BOLUS
INTRAVENOUS | Status: DC | PRN
  Administered 2022-11-27: 90 mg via INTRAVENOUS

## 2022-11-27 MED ORDER — SODIUM CHLORIDE 0.9 % IV SOLN: INTRAVENOUS | Status: DC

## 2022-11-27 MED ORDER — LIDOCAINE HCL (CARDIAC) PF 100 MG/5ML IV SOSY
PREFILLED_SYRINGE | INTRAVENOUS | Status: DC | PRN
  Administered 2022-11-27: 100 mg via INTRAVENOUS

## 2022-11-27 MED ORDER — PROPOFOL 500 MG/50ML IV EMUL
INTRAVENOUS | Status: DC | PRN
  Administered 2022-11-27: 200 ug/kg/min via INTRAVENOUS

## 2022-11-27 NOTE — Anesthesia Preprocedure Evaluation (Signed)
Anesthesia Evaluation  Patient identified by MRN, date of birth, ID band Patient awake    Reviewed: Allergy & Precautions, NPO status , Patient's Chart, lab work & pertinent test results  Airway Mallampati: II  TM Distance: >3 FB Neck ROM: full    Dental no notable dental hx.    Pulmonary neg pulmonary ROS, former smoker   Pulmonary exam normal        Cardiovascular hypertension, On Medications negative cardio ROS Normal cardiovascular exam     Neuro/Psych negative neurological ROS  negative psych ROS   GI/Hepatic negative GI ROS, Neg liver ROS,,,  Endo/Other  negative endocrine ROS    Renal/GU negative Renal ROS  negative genitourinary   Musculoskeletal   Abdominal   Peds  Hematology negative hematology ROS (+)   Anesthesia Other Findings Past Medical History: No date: Arthritis No date: History of chicken pox No date: Hypertension No date: Palpitations No date: Substance abuse [facility])  Past Surgical History: No date: JOINT REPLACEMENT 05/14/2015: TOTAL HIP ARTHROPLASTY; Left     Comment:  Procedure: TOTAL HIP ARTHROPLASTY ANTERIOR APPROACH;                Surgeon: Hessie Knows, MD;  Location: [facility] ORS;  Service:              Orthopedics;  Laterality: Left; 11/24/2016: TOTAL HIP ARTHROPLASTY; Right     Comment:  Procedure: TOTAL HIP ARTHROPLASTY ANTERIOR APPROACH;                Surgeon: Hessie Knows, MD;  Location: [facility] ORS;  Service:              Orthopedics;  Laterality: Right;  BMI    Body Mass Index: 26.50 kg/m      Reproductive/Obstetrics negative OB ROS                             Anesthesia Physical Anesthesia Plan  ASA: 2  Anesthesia Plan: General   Post-op Pain Management: Minimal or no pain anticipated   Induction: Intravenous  PONV Risk Score and Plan: Propofol infusion and TIVA  Airway Management Planned: Natural Airway and Nasal Cannula  Additional  Equipment:   Intra-op Plan:   Post-operative Plan:   Informed Consent: I have reviewed the patients History and Physical, chart, labs and discussed the procedure including the risks, benefits and alternatives for the proposed anesthesia with the patient or authorized representative who has indicated his/her understanding and acceptance.     Dental Advisory Given  Plan Discussed with: Anesthesiologist, CRNA and Surgeon  Anesthesia Plan Comments: (Patient consented for risks of anesthesia including but not limited to:  - adverse reactions to medications - damage to eyes, teeth, lips or other oral mucosa - nerve damage due to positioning  - sore throat or hoarseness - Damage to heart, brain, nerves, lungs, other parts of body or loss of life  Patient voiced understanding.)       Anesthesia Quick Evaluation

## 2022-11-27 NOTE — H&P (Signed)
Jonathon Bellows, MD [address], [place], [place], [place], [address] [address], [place], [place], [place], [address] Phone: [phone]  Fax: [phone]  Primary Care Physician:  Michela Pitcher, NP   Pre-Procedure History & Physical: HPI:  Angel Bautista is a 52 y.o. male is here for an colonoscopy.   Past Medical History:  Diagnosis Date   Arthritis    History of chicken pox    Hypertension    Palpitations    Substance abuse ([place])     Past Surgical History:  Procedure Laterality Date   JOINT REPLACEMENT     TOTAL HIP ARTHROPLASTY Left 05/14/2015   Procedure: TOTAL HIP ARTHROPLASTY ANTERIOR APPROACH;  Surgeon: Hessie Knows, MD;  Location: [facility] ORS;  Service: Orthopedics;  Laterality: Left;   TOTAL HIP ARTHROPLASTY Right 11/24/2016   Procedure: TOTAL HIP ARTHROPLASTY ANTERIOR APPROACH;  Surgeon: Hessie Knows, MD;  Location: [facility] ORS;  Service: Orthopedics;  Laterality: Right;    Prior to Admission medications   Medication Sig Start Date End Date Taking? Authorizing Provider  amLODipine (NORVASC) 10 MG tablet Take 1 tablet (10 mg total) by mouth daily. 10/15/22  Yes Michela Pitcher, NP  lisinopril (ZESTRIL) 40 MG tablet TAKE 1 TABLET BY MOUTH EVERY DAY 09/15/22  Yes Michela Pitcher, NP  sildenafil (REVATIO) 20 MG tablet TAKE ONE TO FOUR TABLETS BY MOUTH ONCE DAILY. TAKE 30 MINUTES TO FOUR HOURS PRIOR TO INTERCOURSE. DO NOT TAKE MORE THAN ONCE IN 63 HOURS 08/24/22   Michela Pitcher, NP  valACYclovir (VALTREX) 500 MG tablet Take 2 tablets x 2 doses as needed for break out Patient not taking: Reported on 11/27/2022 02/25/22   Michela Pitcher, NP    Allergies as of 10/20/2022 - Review Complete 10/15/2022  Allergen Reaction Noted   Tramadol Itching 05/14/2015   Penicillins Swelling 08/05/2007    Family History  Problem Relation Age of Onset   Prostate cancer Father 72   Alcohol abuse Father    Hearing loss Father    Hyperlipidemia Father    Hypertension Father     Cancer Sister    Early death Sister 30   Hypertension Brother    Other Neg Hx     Social History   Socioeconomic History   Marital status: Single    Spouse name: Not on file   Number of children: Not on file   Years of education: Not on file   Highest education level: Not on file  Occupational History   Not on file  Tobacco Use   Smoking status: Former    Packs/day: 1.00    Years: 15.00    Total pack years: 15.00    Types: Cigarettes   Smokeless tobacco: Former  Scientific laboratory technician Use: Never used  Substance and Sexual Activity   Alcohol use: Yes    Alcohol/week: 4.0 standard drinks of alcohol    Types: 4 Cans of beer per week    Comment: daily   Drug use: Not Currently   Sexual activity: Not on file  Other Topics Concern   Not on file  Social History Narrative   Not on file   Social Determinants of Health   Financial Resource Strain: Not on file  Food Insecurity: Not on file  Transportation Needs: Not on file  Physical Activity: Not on file  Stress: Not on file  Social Connections: Not on file  Intimate Partner Violence: Not on file  Review of Systems: See HPI, otherwise negative ROS  Physical Exam: BP 136/80   Pulse 71   Temp (!) 96.9 F (36.1 C) (Temporal)   Resp 16   Ht 5\' 11"  (1.803 m)   Wt 86.2 kg   SpO2 100%   BMI 26.50 kg/m  General:   Alert,  pleasant and cooperative in NAD Head:  Normocephalic and atraumatic. Neck:  Supple; no masses or thyromegaly. Lungs:  Clear throughout to auscultation, normal respiratory effort.    Heart:  +S1, +S2, Regular rate and rhythm, No edema. Abdomen:  Soft, nontender and nondistended. Normal bowel sounds, without guarding, and without rebound.   Neurologic:  Alert and  oriented x4;  grossly normal neurologically.  Impression/Plan: Angel Bautista is here for an colonoscopy to be performed for family history of colon polyps Risks, benefits, limitations, and alternatives regarding  colonoscopy have been  reviewed with the patient.  Questions have been answered.  All parties agreeable.   Jonathon Bellows, MD  11/27/2022, 9:34 AM

## 2022-11-27 NOTE — Op Note (Signed)
[facility] Patient Name: Angel Bautista Procedure Date: 11/27/2022 9:39 AM MRN: [id number] Account #: [id number] Date of Birth: 10-05-1971 Admit Type: Outpatient Age: 52 Room: [facility] ENDO ROOM 4 Gender: Male Note Status: Finalized Instrument Name: Park Meo [id number] Procedure:             Colonoscopy Indications:           Colon cancer screening in patient at increased risk:                         Family history of 1st-degree relative with colon polyps Providers:             Jonathon Bellows MD, MD Referring MD:          Alyson Locket. Charmian Muff (Referring MD) Medicines:             Monitored Anesthesia Care Complications:         No immediate complications. Procedure:             Pre-Anesthesia Assessment:                        - Prior to the procedure, a History and Physical was                         performed, and patient medications, allergies and                         sensitivities were reviewed. The patient's tolerance                         of previous anesthesia was reviewed.                        - The risks and benefits of the procedure and the                         sedation options and risks were discussed with the                         patient. All questions were answered and informed                         consent was obtained.                        - ASA Grade Assessment: II - A patient with mild                         systemic disease.                        After obtaining informed consent, the colonoscope was                         passed under direct vision. Throughout the procedure,                         the patient's blood pressure, pulse, and oxygen  saturations were monitored continuously. The                         Colonoscope was introduced through the anus and                         advanced to the the cecum, identified by the                         appendiceal orifice. The colonoscopy was  performed                         with ease. The patient tolerated the procedure well.                         The quality of the bowel preparation was excellent.                         The ileocecal valve, appendiceal orifice, and rectum                         were photographed. Findings:      The perianal and digital rectal examinations were normal.      Multiple small-mouthed diverticula were found in the sigmoid colon.      Non-bleeding internal hemorrhoids were found during retroflexion. The       hemorrhoids were medium-sized and Grade I (internal hemorrhoids that do       not prolapse).      The exam was otherwise without abnormality on direct and retroflexion       views. Impression:            - Diverticulosis in the sigmoid colon.                        - Non-bleeding internal hemorrhoids.                        - The examination was otherwise normal on direct and                         retroflexion views.                        - No specimens collected. Recommendation:        - Discharge patient to home (with escort).                        - Resume previous diet.                        - Continue present medications.                        - Repeat colonoscopy in 5 years for screening purposes. Procedure Code(s):     --- Professional ---                        [phone], Colonoscopy, flexible; diagnostic, including                         collection  of specimen(s) by brushing or washing, when                         performed (separate procedure) Diagnosis Code(s):     --- Professional ---                        Z83.71, Family history of colonic polyps                        K64.0, First degree hemorrhoids                        K57.30, Diverticulosis of large intestine without                         perforation or abscess without bleeding CPT copyright 2022 American Medical Association. All rights reserved. The codes documented in this report are preliminary and upon  coder review may  be revised to meet current compliance requirements. Jonathon Bellows, MD Jonathon Bellows MD, MD 11/27/2022 9:55:34 AM This report has been signed electronically. Number of Addenda: 0 Note Initiated On: 11/27/2022 9:39 AM Scope Withdrawal Time: 0 hours 6 minutes 5 seconds  Total Procedure Duration: 0 hours 7 minutes 51 seconds  Estimated Blood Loss:  Estimated blood loss: none.      [facility]

## 2022-11-27 NOTE — Anesthesia Postprocedure Evaluation (Signed)
Anesthesia Post Note  Patient: Angel Bautista  Procedure(s) Performed: COLONOSCOPY WITH PROPOFOL  Patient location during evaluation: Endoscopy Anesthesia Type: General Level of consciousness: awake and alert Pain management: pain level controlled Vital Signs Assessment: post-procedure vital signs reviewed and stable Respiratory status: spontaneous breathing, nonlabored ventilation, respiratory function stable and patient connected to nasal cannula oxygen Cardiovascular status: blood pressure returned to baseline and stable Postop Assessment: no apparent nausea or vomiting Anesthetic complications: no   No notable events documented.   Last Vitals:  Vitals:   11/27/22 1002 11/27/22 1007  BP: 109/66 107/71  Pulse:    Resp:    Temp:    SpO2:      Last Pain:  Vitals:   11/27/22 1007  TempSrc:   PainSc: 0-No pain                 Ilene Qua

## 2022-11-27 NOTE — Transfer of Care (Signed)
Immediate Anesthesia Transfer of Care Note  Patient: Angel Bautista  Procedure(s) Performed: COLONOSCOPY WITH PROPOFOL  Patient Location: Endoscopy Unit  Anesthesia Type:General  Level of Consciousness: drowsy  Airway & Oxygen Therapy: Patient Spontanous Breathing  Post-op Assessment: Report given to RN and Post -op Vital signs reviewed and stable  Post vital signs: Reviewed and stable  Last Vitals:  Vitals Value Taken Time  BP 85/58 11/27/22 0958  Temp    Pulse 65 11/27/22 0957  Resp 15 11/27/22 0958  SpO2 97 % 11/27/22 0957  Vitals shown include unvalidated device data.  Last Pain:  Vitals:   11/27/22 0901  TempSrc: Temporal  PainSc: 0-No pain         Complications: No notable events documented.

## 2022-11-30 ENCOUNTER — Encounter: Payer: Self-pay | Admitting: Gastroenterology

## 2022-12-24 ENCOUNTER — Ambulatory Visit (INDEPENDENT_AMBULATORY_CARE_PROVIDER_SITE_OTHER)
Admission: RE | Admit: 2022-12-24 | Discharge: 2022-12-24 | Disposition: A | Discharge status: Home / Self Care | Payer: BLUE CROSS/BLUE SHIELD | Source: Ambulatory Visit | Attending: Family Medicine | Admitting: Family Medicine

## 2022-12-24 ENCOUNTER — Ambulatory Visit: Payer: BLUE CROSS/BLUE SHIELD | Admitting: Family Medicine

## 2022-12-24 ENCOUNTER — Encounter: Payer: Self-pay | Admitting: Family Medicine

## 2022-12-24 VITALS — BP 138/66 | HR 84 | Temp 98.0°F | Ht 70.0 in | Wt 202.4 lb

## 2022-12-24 DIAGNOSIS — T84030A Mechanical loosening of internal right hip prosthetic joint, initial encounter: Secondary | ICD-10-CM | POA: Diagnosis not present

## 2022-12-24 DIAGNOSIS — Z96642 Presence of left artificial hip joint: Secondary | ICD-10-CM | POA: Diagnosis not present

## 2022-12-24 DIAGNOSIS — M25551 Pain in right hip: Secondary | ICD-10-CM

## 2022-12-24 MED ORDER — CELECOXIB 200 MG PO CAPS: 200.0000 mg | ORAL_CAPSULE | Freq: Every day | ORAL | 2 refills | Status: DC

## 2022-12-24 NOTE — Progress Notes (Signed)
Angel Bautista T. Angel Shader, MD, [place] [facility] [place], [address]  Phone: [phone]  FAX: [place] - 52 y.o. male  MRN [id number]  Date of Birth: 22-Jun-1971  Date: 12/24/2022  PCP: Michela Pitcher, NP  Referral: Michela Pitcher, NP  Chief Complaint  Patient presents with   Hip Pain    Right-Had hip replacement 5 years ago-Hurts to stretch-Patient is concerned about a gluteal tendon tear   Subjective:   Angel Bautista is a 51 y.o. very pleasant male patient with Body mass index is 29.04 kg/m. who presents with the following:  Patient presents with a question of some hip pain with a question of possible gluteal tear per the patient.  He has a history of prior B total hip arthroplasty done in 2018 by Dr. Gordy Levan.   Has had a prior R sided hip pain.  This current pain in the anterior hip and groin has been present  Has quit doing legs at the gym.  It hurts too bad.  Has been stretching.  Cannot sleep, and not really able to walk the next day even after doing basic air squats.   At baseline, he is very active, and he avidly works out and [employer].  He is at a completely eliminate any lower body activity due to anterior groin pain.  He has no significant tenderness on the left side at all.  No pain.  He has not had any trauma or accident.  He has had an uneventful 5 years with his total hip arthroplasty.  Review of Systems is noted in the HPI, as appropriate  Objective:   BP 138/66   Pulse 84   Temp 98 F (36.7 C) (Temporal)   Ht '5\' 10"'$  (1.778 m)   Wt 202 lb 6 oz (91.8 kg)   SpO2 95%   BMI 29.04 kg/m   GEN: No acute distress; alert,appropriate. PULM: Breathing comfortably in no respiratory distress PSYCH: Normally interactive.   Right hip: Anterior hip is tender to palpation. Limited by pain in abduction to 30 degrees. With the hip flexed to 90 degrees, he is limited to  roughly 20 degrees of total rotational maneuvers. He is tender at the insertion of the hip flexor caudally, but he also has tenderness above this as well to palpation.  Hip flexion: 3+/5 Hip abduction 4 -/5 Hip abduction 4 - minus/5  Laboratory and Imaging Data: DG Hip Unilat W OR W/O Pelvis 2-3 Views Right  Result Date: 12/24/2022 CLINICAL DATA:  Hip pain EXAM: DG HIP (WITH OR WITHOUT PELVIS) 2-3V RIGHT COMPARISON:  11/24/2016 FINDINGS: Postsurgical changes from right total hip arthroplasty. Arthroplasty components are in their expected alignment without dislocation. Interval development of mild perihardware lucency in the proximal subtrochanteric right femur. Minimal perihardware lucency around the distal femoral stem. No periprosthetic fracture. There is some heterotopic ossification adjacent to the femoral head. Atherosclerotic vascular calcifications are present. IMPRESSION: Interval development of mild perihardware lucency in the proximal subtrochanteric right femur. Minimal perihardware lucency around the distal femoral stem. Appearance suggestive of hardware loosening. Electronically Signed   By: Davina Poke D.O.   On: 12/24/2022 12:40     Assessment and Plan:     ICD-10-CM   1. 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 Acute right hip pain  M25.551 DG Hip  Unilat W OR W/O Pelvis 2-3 Views Right    Ambulatory referral to Orthopedic Surgery    3. History of hip replacement, total, left  Z96.642 DG Hip Unilat W OR W/O Pelvis 2-3 Views Right    Ambulatory referral to Orthopedic Surgery     There is radiographical evidence of significant loosening of the femoral component to his total hip arthroplasty.  I worry that this is the predominant cause of his pain.  His pain seems significantly worse and out of character to a hip flexor simple strain.  Think he will be best served to see his  total joint surgeon for probable complications.  I am going to give him some scheduled Celebrex and help arrange this appointment for him.  Medication Management during today's office visit: Meds ordered this encounter  Medications   celecoxib (CELEBREX) 200 MG capsule    Sig: Take 1 capsule (200 mg total) by mouth daily.    Dispense:  30 capsule    Refill:  2   There are no discontinued medications.  Orders placed today for conditions managed today: Orders Placed This Encounter  Procedures   DG Hip Unilat W OR W/O Pelvis 2-3 Views Right   Ambulatory referral to Orthopedic Surgery    Disposition: No follow-ups on file.  [employer] speech-to-text software was used for transcription in this dictation.  Possible transcriptional errors can occur using Editor, commissioning.   Signed,  Maud Deed. Avila Albritton, MD   Outpatient Encounter Medications as of 12/24/2022  Medication Sig   amLODipine (NORVASC) 10 MG tablet Take 1 tablet (10 mg total) by mouth daily.   celecoxib (CELEBREX) 200 MG capsule Take 1 capsule (200 mg total) by mouth daily.   lisinopril (ZESTRIL) 40 MG tablet TAKE 1 TABLET BY MOUTH EVERY DAY   sildenafil (REVATIO) 20 MG tablet TAKE ONE TO FOUR TABLETS BY MOUTH ONCE DAILY. TAKE 30 MINUTES TO FOUR HOURS PRIOR TO INTERCOURSE. DO NOT TAKE MORE THAN ONCE IN 24 HOURS   valACYclovir (VALTREX) 500 MG tablet Take 2 tablets x 2 doses as needed for break out   No facility-administered encounter medications on file as of 12/24/2022.

## 2022-12-28 ENCOUNTER — Encounter: Payer: Self-pay | Admitting: *Deleted

## 2023-01-14 ENCOUNTER — Ambulatory Visit: Payer: BLUE CROSS/BLUE SHIELD | Admitting: Nurse Practitioner

## 2023-01-14 ENCOUNTER — Encounter: Payer: Self-pay | Admitting: Nurse Practitioner

## 2023-01-14 DIAGNOSIS — I10 Essential (primary) hypertension: Secondary | ICD-10-CM | POA: Diagnosis not present

## 2023-01-14 MED ORDER — AMLODIPINE BESYLATE 10 MG PO TABS: 10.0000 mg | ORAL_TABLET | Freq: Every day | ORAL | 2 refills | Status: DC

## 2023-01-14 MED ORDER — LISINOPRIL 40 MG PO TABS: 40.0000 mg | ORAL_TABLET | Freq: Every day | ORAL | 2 refills | Status: DC

## 2023-01-14 NOTE — Patient Instructions (Signed)
Nice to see you today I have sent in refills for both your blood pressure medications Follow up with me in approx 9 months for your next physical with labs

## 2023-01-14 NOTE — Progress Notes (Signed)
   Established Patient Office Visit  Subjective   Patient ID: Angel Bautista, male    DOB: 1971/01/27  Age: 52 y.o. MRN: [id number]  Chief Complaint  Patient presents with   Hypertension      HTN: Patient currently maintained on lisinopril 40 mg and amlodipine 10 mg.  Blood pressure at last office visit was above goal.  Patient is here for recheck States that he does no check his blood pressure at home.  States he has noticed that he is urinating more frequently at night.  He is taking both blood pressure pills in the morning per his report    Review of Systems  Constitutional:  Negative for chills and fever.  Eyes:  Negative for blurred vision and double vision.  Respiratory:  Negative for shortness of breath.   Cardiovascular:  Positive for leg swelling. Negative for chest pain.  Neurological:  Negative for headaches.      Objective:     BP 134/72   Pulse 86   Temp 99.6 F (37.6 C)   Resp 16   Ht 5\' 10"  (1.778 m)   Wt 205 lb (93 kg)   SpO2 96%   BMI 29.41 kg/m  BP Readings from Last 3 Encounters:  01/14/23 134/72  12/24/22 138/66  11/27/22 107/71   Wt Readings from Last 3 Encounters:  01/14/23 205 lb (93 kg)  12/24/22 202 lb 6 oz (91.8 kg)  11/27/22 190 lb (86.2 kg)      Physical Exam Vitals and nursing note reviewed.  Constitutional:      Appearance: Normal appearance.  Cardiovascular:     Rate and Rhythm: Normal rate and regular rhythm.     Heart sounds: Normal heart sounds.  Pulmonary:     Effort: Pulmonary effort is normal.     Breath sounds: Normal breath sounds.  Neurological:     Mental Status: He is alert.      No results found for any visits on 01/14/23.    The ASCVD Risk score (Arnett DK, et al., 2019) failed to calculate for the following reasons:   The valid total cholesterol range is 130 to 320 mg/dL    Assessment & Plan:   Problem List Items Addressed This Visit       Cardiovascular and Mediastinum   Essential  hypertension    Patient currently maintained on lisinopril 40 mg amlodipine 10 mg.  Refill sent for both medications.  Blood pressure has been within normal limits for 2 office visits.  Continue medication as prescribed      Relevant Medications   lisinopril (ZESTRIL) 40 MG tablet   amLODipine (NORVASC) 10 MG tablet    Return in about 9 months (around 10/17/2023) for CPE and Labs.    Romilda Garret, NP

## 2023-01-14 NOTE — Assessment & Plan Note (Signed)
Patient currently maintained on lisinopril 40 mg amlodipine 10 mg.  Refill sent for both medications.  Blood pressure has been within normal limits for 2 office visits.  Continue medication as prescribed

## 2023-01-19 ENCOUNTER — Other Ambulatory Visit: Payer: Self-pay | Admitting: Orthopedic Surgery

## 2023-01-19 DIAGNOSIS — Z96649 Presence of unspecified artificial hip joint: Secondary | ICD-10-CM

## 2023-01-20 ENCOUNTER — Other Ambulatory Visit: Payer: Self-pay | Admitting: Orthopedic Surgery

## 2023-01-20 DIAGNOSIS — Z96649 Presence of unspecified artificial hip joint: Secondary | ICD-10-CM

## 2023-01-25 ENCOUNTER — Encounter
Admission: RE | Admit: 2023-01-25 | Discharge: 2023-01-25 | Disposition: A | Discharge status: Home / Self Care | Payer: BLUE CROSS/BLUE SHIELD | Source: Ambulatory Visit | Attending: Orthopedic Surgery | Admitting: Orthopedic Surgery

## 2023-01-25 ENCOUNTER — Ambulatory Visit
Admission: RE | Admit: 2023-01-25 | Discharge: 2023-01-25 | Disposition: A | Discharge status: Home / Self Care | Payer: BLUE CROSS/BLUE SHIELD | Source: Ambulatory Visit | Attending: Orthopedic Surgery | Admitting: Orthopedic Surgery

## 2023-01-25 DIAGNOSIS — T8484XA Pain due to internal orthopedic prosthetic devices, implants and grafts, initial encounter: Secondary | ICD-10-CM | POA: Insufficient documentation

## 2023-01-25 DIAGNOSIS — Z96649 Presence of unspecified artificial hip joint: Secondary | ICD-10-CM | POA: Insufficient documentation

## 2023-01-25 MED ORDER — TECHNETIUM TC 99M MEDRONATE IV KIT
20.0000 | PACK | Freq: Once | INTRAVENOUS | Status: AC | PRN
  Administered 2023-01-25: 20.39 via INTRAVENOUS

## 2023-02-04 ENCOUNTER — Other Ambulatory Visit: Payer: Self-pay | Admitting: Orthopedic Surgery

## 2023-02-08 ENCOUNTER — Encounter: Payer: Self-pay | Admitting: Orthopedic Surgery

## 2023-02-08 ENCOUNTER — Encounter
Admission: RE | Admit: 2023-02-08 | Discharge: 2023-02-08 | Disposition: A | Discharge status: Home / Self Care | Payer: BLUE CROSS/BLUE SHIELD | Source: Ambulatory Visit | Attending: Orthopedic Surgery | Admitting: Orthopedic Surgery

## 2023-02-08 ENCOUNTER — Other Ambulatory Visit: Payer: Self-pay

## 2023-02-08 ENCOUNTER — Telehealth: Payer: Self-pay | Admitting: Nurse Practitioner

## 2023-02-08 DIAGNOSIS — Z01818 Encounter for other preprocedural examination: Secondary | ICD-10-CM | POA: Diagnosis present

## 2023-02-08 DIAGNOSIS — M1611 Unilateral primary osteoarthritis, right hip: Secondary | ICD-10-CM | POA: Insufficient documentation

## 2023-02-08 LAB — CBC WITH DIFFERENTIAL/PLATELET
Abs Immature Granulocytes: 0.04 10*3/uL (ref 0.00–0.07)
Basophils Absolute: 0 10*3/uL (ref 0.0–0.1)
Basophils Relative: 1 %
Eosinophils Absolute: 0.1 10*3/uL (ref 0.0–0.5)
Eosinophils Relative: 1 %
HCT: 42.7 % (ref 39.0–52.0)
Hemoglobin: 15 g/dL (ref 13.0–17.0)
Immature Granulocytes: 1 %
Lymphocytes Relative: 16 %
Lymphs Abs: 0.9 10*3/uL (ref 0.7–4.0)
MCH: 30.9 pg (ref 26.0–34.0)
MCHC: 35.1 g/dL (ref 30.0–36.0)
MCV: 88 fL (ref 80.0–100.0)
Monocytes Absolute: 0.5 10*3/uL (ref 0.1–1.0)
Monocytes Relative: 9 %
Neutro Abs: 3.9 10*3/uL (ref 1.7–7.7)
Neutrophils Relative %: 72 %
Platelets: 188 10*3/uL (ref 150–400)
RBC: 4.85 MIL/uL (ref 4.22–5.81)
RDW: 12 % (ref 11.5–15.5)
WBC: 5.4 10*3/uL (ref 4.0–10.5)
nRBC: 0 % (ref 0.0–0.2)

## 2023-02-08 LAB — COMPREHENSIVE METABOLIC PANEL
ALT: 32 U/L (ref 0–44)
AST: 31 U/L (ref 15–41)
Albumin: 4.5 g/dL (ref 3.5–5.0)
Alkaline Phosphatase: 50 U/L (ref 38–126)
Anion gap: 9 (ref 5–15)
BUN: 18 mg/dL (ref 6–20)
CO2: 28 mmol/L (ref 22–32)
Calcium: 8.9 mg/dL (ref 8.9–10.3)
Chloride: 97 mmol/L — ABNORMAL LOW (ref 98–111)
Creatinine, Ser: 1.08 mg/dL (ref 0.61–1.24)
GFR, Estimated: >60 mL/min (ref >60)
Glucose, Bld: 105 mg/dL — ABNORMAL HIGH (ref 70–99)
Potassium: 4.2 mmol/L (ref 3.5–5.1)
Sodium: 134 mmol/L — ABNORMAL LOW (ref 135–145)
Total Bilirubin: 0.7 mg/dL (ref 0.3–1.2)
Total Protein: 6.8 g/dL (ref 6.5–8.1)

## 2023-02-08 LAB — TYPE AND SCREEN
ABO/RH(D): B NEG
Antibody Screen: NEGATIVE

## 2023-02-08 LAB — URINALYSIS, ROUTINE W REFLEX MICROSCOPIC
Bilirubin Urine: NEGATIVE
Glucose, UA: NEGATIVE mg/dL
Hgb urine dipstick: NEGATIVE
Ketones, ur: NEGATIVE mg/dL
Leukocytes,Ua: NEGATIVE
Nitrite: NEGATIVE
Protein, ur: NEGATIVE mg/dL
Specific Gravity, Urine: 1.021 (ref 1.005–1.030)
pH: 6 (ref 5.0–8.0)

## 2023-02-08 LAB — SURGICAL PCR SCREEN
MRSA, PCR: NEGATIVE
Staphylococcus aureus: NEGATIVE

## 2023-02-08 NOTE — Telephone Encounter (Signed)
Received a call from [facility] regarding a form faxed to [place] for pre op clearance. Informed that we would need to see pt in office before [facility] can sign off. Called patient to sched pre op appt, no answer LVM.

## 2023-02-08 NOTE — Patient Instructions (Addendum)
Your procedure is scheduled on:  02/18/2023  Report to the Registration Desk on the 1st floor of the [facility]. To find out your arrival time, please call [phone] between 1PM - 3PM on:  02/17/2023  If your arrival time is 6:00 am, do not arrive before that time as the [facility] entrance doors do not open until 6:00 am.  REMEMBER: Instructions that are not followed completely may result in serious medical risk, up to and including death; or upon the discretion of your surgeon and anesthesiologist your surgery may need to be rescheduled.  Do not eat food after midnight the night before surgery.  No gum chewing or hard candies.  You may however, drink CLEAR liquids up to 2 hours before you are scheduled to arrive for your surgery. Do not drink anything within 2 hours of your scheduled arrival time.  Clear liquids include: - water  - apple juice without pulp - gatorade (not RED colors) - black coffee or tea (Do NOT add milk or creamers to the coffee or tea) Do NOT drink anything that is not on this list.   In addition, your doctor has ordered for you to drink the provided:  Ensure Pre-Surgery Clear Carbohydrate Drink   Drinking this carbohydrate drink up to two hours before surgery helps to reduce insulin resistance and improve patient outcomes. Please complete drinking 2 hours before scheduled arrival time.  One week prior to surgery: Stop Anti-inflammatories (NSAIDS) such as Advil, Aleve, Ibuprofen, Motrin, Naproxen, Naprosyn and Aspirin based products such as Excedrin, Goody's Powder, BC Powder. Stop ANY OVER THE COUNTER supplements until after surgery Multiple Vitamins  You may however, continue to take Tylenol if needed for pain up until the day of surgery.  Continue taking all prescribed medications.  TAKE ONLY THESE MEDICATIONS THE MORNING OF SURGERY WITH A SIP OF WATER:  Amlodipine  2.   Celecoxib   No Alcohol for 24 hours before or after surgery.  No Smoking  including e-cigarettes for 24 hours before surgery.  No chewable tobacco products for at least 6 hours before surgery.  No nicotine patches on the day of surgery.  Do not use any "recreational" drugs for at least a week (preferably 2 weeks) before your surgery.  Please be advised that the combination of cocaine and anesthesia may have negative outcomes, up to and including death. If you test positive for cocaine, your surgery will be cancelled.  On the morning of surgery brush your teeth with toothpaste and water, you may rinse your mouth with mouthwash if you wish. Do not swallow any toothpaste or mouthwash.  Use CHG Soap as directed on instruction sheet.  Do not wear jewelry, make-up, hairpins, clips or nail polish.  Do not wear lotions, powders, or perfumes.   Do not shave body hair from the neck down 48 hours before surgery.  Contact lenses, hearing aids and dentures may not be worn into surgery.  Do not bring valuables to the hospital. [facility] is not responsible for any missing/lost belongings or valuables.   Notify your doctor if there is any change in your medical condition (cold, fever, infection).  Wear comfortable clothing (specific to your surgery type) to the hospital.  After surgery, you can help prevent lung complications by doing breathing exercises.  Take deep breaths and cough every 1-2 hours. Your doctor may order a device called an Incentive Spirometer to help you take deep breaths.  If you are being admitted to the hospital overnight,  leave your suitcase in the car. After surgery it may be brought to your room.  In case of increased patient census, it may be necessary for you, the patient, to continue your postoperative care in the Same Day Surgery department.  If you are being discharged the day of surgery, you will not be allowed to drive home. You will need a responsible individual to drive you home and stay with you for 24 hours after surgery.   Please  call the Pre-admissions Testing Dept. at [phone] if you have any questions about these instructions.  Surgery Visitation Policy:  Patients having surgery or a procedure may have two visitors.  Children under the age of 54 must have an adult with them who is not the patient.  Inpatient Visitation:    Visiting hours are 7 a.m. to 8 p.m. Up to four visitors are allowed at one time in a patient room. The visitors may rotate out with other people during the day.  One visitor age 54 or older may stay with the patient overnight and must be in the room by 8 p.m.  Preoperative Educational Videos for Total Hip, Knee and Shoulder Replacements  To better prepare for surgery, please view our videos that explain the physical activity and discharge planning required to have the best surgical recovery at [facility].  [email or web address]  Questions? Call [phone] or email [email or web address] Educational Videos for Total Hip, Knee and Shoulder Replacements        Preparing for Surgery with CHLORHEXIDINE GLUCONATE (CHG) Soap  Chlorhexidine Gluconate (CHG) Soap  o An antiseptic cleaner that kills germs and bonds with the skin to continue killing germs even after washing  o Used for showering the night before surgery and morning of surgery  Before surgery, you can play an important role by reducing the number of germs on your skin.  CHG (Chlorhexidine gluconate) soap is an antiseptic cleanser which kills germs and bonds with the skin to continue killing germs even after washing.  Please do not use if you have an allergy to CHG or antibacterial soaps. If your skin becomes reddened/irritated stop using the CHG.  1. Shower the NIGHT BEFORE SURGERY and the MORNING OF SURGERY with CHG soap.  2. If you choose to wash your hair, wash your hair first as usual with your normal shampoo.  3. After shampooing, rinse your hair and body  thoroughly to remove the shampoo.  4. Use CHG as you would any other liquid soap. You can apply CHG directly to the skin and wash gently with a scrungie or a clean washcloth.  5. Apply the CHG soap to your body only from the neck down. Do not use on open wounds or open sores. Avoid contact with your eyes, ears, mouth, and genitals (private parts). Wash face and genitals (private parts) with your normal soap.  6. Wash thoroughly, paying special attention to the area where your surgery will be performed.  7. Thoroughly rinse your body with warm water.  8. Do not shower/wash with your normal soap after using and rinsing off the CHG soap.  9. Pat yourself dry with a clean towel.  10. Wear clean pajamas to bed the night before surgery.  12. Place clean sheets on your bed the night of your first shower and do not sleep with pets.  13. Shower again with the CHG soap on the day of surgery prior to arriving at the hospital.  14. Do not apply any deodorants/lotions/powders.  15. Please wear clean clothes to the hospital.

## 2023-02-09 ENCOUNTER — Telehealth: Payer: Self-pay | Admitting: Nurse Practitioner

## 2023-02-09 NOTE — Telephone Encounter (Signed)
Got a form requesting operative clearance for an ortho procedure. Can we get an office visit

## 2023-02-09 NOTE — Telephone Encounter (Signed)
Pt scheduled for 4/15 for clearance.

## 2023-02-09 NOTE — Telephone Encounter (Signed)
Placed in provider box will see pt on 02/15/2023

## 2023-02-15 ENCOUNTER — Ambulatory Visit: Payer: BLUE CROSS/BLUE SHIELD | Admitting: Nurse Practitioner

## 2023-02-15 ENCOUNTER — Encounter: Payer: Self-pay | Admitting: Nurse Practitioner

## 2023-02-15 VITALS — BP 120/64 | HR 75 | Temp 98.9°F | Resp 16 | Ht 71.0 in | Wt 206.1 lb

## 2023-02-15 DIAGNOSIS — Z01818 Encounter for other preprocedural examination: Secondary | ICD-10-CM | POA: Diagnosis not present

## 2023-02-15 DIAGNOSIS — I10 Essential (primary) hypertension: Secondary | ICD-10-CM | POA: Diagnosis not present

## 2023-02-15 NOTE — Patient Instructions (Signed)
Nice to see you today I will clear you for your surgical procedure.  I will fax the forms over the Dr Audelia Acton

## 2023-02-15 NOTE — Progress Notes (Signed)
   Established Patient Office Visit  Subjective   Patient ID: Angel Bautista, male    DOB: 1971/09/06  Age: 52 y.o. MRN: [id number]  Chief Complaint  Patient presents with   Pre-op Exam    HPI   Pre operative clearance: Patient is scheduled to have a total right hip revision.  This will be done under the direction of Reinaldo Berber, MD.  Angel Bautista was received by the office for preoperative clearance  Hx of HTN: well controlled on anti hypertensive medications Former smoker  No trouble with anesthesia in the past. No trouble going to sleep or waking up. Has had both hips done In the past No history of malignant hyperthermia   He has had pre op clearance through the pre op team and an EKG was done.  I did review this and it was within normal limits.  Just like it was confirmed by one of the cardiologist in the group   Review of Systems  Constitutional:  Negative for chills and fever.  Respiratory:  Negative for shortness of breath.   Cardiovascular:  Negative for chest pain.  Neurological:  Negative for headaches.      Objective:     BP 120/64   Pulse 75   Temp 98.9 F (37.2 C)   Resp 16   Ht 5\' 11"  (1.803 m)   Wt 206 lb 2 oz (93.5 kg)   SpO2 98%   BMI 28.75 kg/m    Physical Exam Vitals and nursing note reviewed.  Constitutional:      Appearance: Normal appearance.  Cardiovascular:     Rate and Rhythm: Normal rate and regular rhythm.     Heart sounds: Normal heart sounds.  Pulmonary:     Effort: Pulmonary effort is normal.     Breath sounds: Normal breath sounds.  Neurological:     Mental Status: He is alert.      No results found for any visits on 02/15/23.    The ASCVD Risk score (Arnett DK, et al., 2019) failed to calculate for the following reasons:   The valid total cholesterol range is 130 to 320 mg/dL    Assessment & Plan:   Problem List Items Addressed This Visit       Cardiovascular and Mediastinum   Essential hypertension     Patient currently maintained on lisinopril 40 and amlodipine 10 mg.  Taking medication as prescribed blood pressure is within normal limits.  Continue medication as prescribed        Other   Pre-op examination - Primary    Patient was seen by anesthesia team EKG obtained I did review.  Patient is low risk and was cleared surgically.  Did run information through ACS surgical clearance calculator and will attach to be faxed over to patient's surgeon.       Return if symptoms worsen or fail to improve, for As scheduled for CPE and labs.    Audria Nine, NP

## 2023-02-15 NOTE — Assessment & Plan Note (Signed)
Patient currently maintained on lisinopril 40 and amlodipine 10 mg.  Taking medication as prescribed blood pressure is within normal limits.  Continue medication as prescribed

## 2023-02-15 NOTE — Assessment & Plan Note (Signed)
Patient was seen by anesthesia team EKG obtained I did review.  Patient is low risk and was cleared surgically.  Did run information through ACS surgical clearance calculator and will attach to be faxed over to patient's surgeon.

## 2023-02-18 ENCOUNTER — Other Ambulatory Visit: Payer: Self-pay

## 2023-02-18 ENCOUNTER — Inpatient Hospital Stay: Payer: BLUE CROSS/BLUE SHIELD | Admitting: Urgent Care

## 2023-02-18 ENCOUNTER — Inpatient Hospital Stay: Payer: BLUE CROSS/BLUE SHIELD | Admitting: General Practice

## 2023-02-18 ENCOUNTER — Inpatient Hospital Stay: Payer: BLUE CROSS/BLUE SHIELD

## 2023-02-18 ENCOUNTER — Encounter: Payer: Self-pay | Admitting: Orthopedic Surgery

## 2023-02-18 ENCOUNTER — Inpatient Hospital Stay
Admission: RE | Admit: 2023-02-18 | Discharge: 2023-02-19 | DRG: 468 | Disposition: A | Discharge status: Home Health | Payer: BLUE CROSS/BLUE SHIELD | Attending: Orthopedic Surgery | Admitting: Orthopedic Surgery

## 2023-02-18 ENCOUNTER — Encounter
Admission: RE | Disposition: A | Discharge status: Home Health | Payer: Self-pay | Source: Home / Self Care | Attending: Orthopedic Surgery

## 2023-02-18 DIAGNOSIS — Z8249 Family history of ischemic heart disease and other diseases of the circulatory system: Secondary | ICD-10-CM

## 2023-02-18 DIAGNOSIS — Z8042 Family history of malignant neoplasm of prostate: Secondary | ICD-10-CM | POA: Diagnosis not present

## 2023-02-18 DIAGNOSIS — T8484XA Pain due to internal orthopedic prosthetic devices, implants and grafts, initial encounter: Secondary | ICD-10-CM | POA: Diagnosis present

## 2023-02-18 DIAGNOSIS — Z79899 Other long term (current) drug therapy: Secondary | ICD-10-CM | POA: Diagnosis not present

## 2023-02-18 DIAGNOSIS — Z87891 Personal history of nicotine dependence: Secondary | ICD-10-CM | POA: Diagnosis not present

## 2023-02-18 DIAGNOSIS — Z8049 Family history of malignant neoplasm of other genital organs: Secondary | ICD-10-CM | POA: Diagnosis not present

## 2023-02-18 DIAGNOSIS — I10 Essential (primary) hypertension: Secondary | ICD-10-CM | POA: Diagnosis present

## 2023-02-18 DIAGNOSIS — Z791 Long term (current) use of non-steroidal anti-inflammatories (NSAID): Secondary | ICD-10-CM | POA: Diagnosis not present

## 2023-02-18 DIAGNOSIS — Y792 Prosthetic and other implants, materials and accessory orthopedic devices associated with adverse incidents: Secondary | ICD-10-CM | POA: Diagnosis present

## 2023-02-18 DIAGNOSIS — T84050A Periprosthetic osteolysis of internal prosthetic right hip joint, initial encounter: Secondary | ICD-10-CM | POA: Diagnosis present

## 2023-02-18 DIAGNOSIS — Z96641 Presence of right artificial hip joint: Secondary | ICD-10-CM | POA: Diagnosis present

## 2023-02-18 DIAGNOSIS — Z823 Family history of stroke: Secondary | ICD-10-CM | POA: Diagnosis not present

## 2023-02-18 DIAGNOSIS — M1611 Unilateral primary osteoarthritis, right hip: Principal | ICD-10-CM

## 2023-02-18 DIAGNOSIS — Z888 Allergy status to other drugs, medicaments and biological substances status: Secondary | ICD-10-CM

## 2023-02-18 DIAGNOSIS — T84060A Wear of articular bearing surface of internal prosthetic right hip joint, initial encounter: Secondary | ICD-10-CM | POA: Diagnosis present

## 2023-02-18 DIAGNOSIS — Z880 Allergy status to penicillin: Secondary | ICD-10-CM

## 2023-02-18 HISTORY — DX: Pain due to internal orthopedic prosthetic devices, implants and grafts, subsequent encounter: T84.84XD

## 2023-02-18 HISTORY — PX: TOTAL HIP REVISION: SHX763

## 2023-02-18 HISTORY — DX: Periprosthetic osteolysis of internal prosthetic right hip joint, initial encounter: T84.050A

## 2023-02-18 HISTORY — DX: Presence of unspecified artificial hip joint: Z96.649

## 2023-02-18 LAB — BODY FLUID CELL COUNT WITH DIFFERENTIAL
Eos, Fluid: 0 %
Lymphs, Fluid: 50 %
Monocyte-Macrophage-Serous Fluid: 26 %
Neutrophil Count, Fluid: 24 %
Total Nucleated Cell Count, Fluid: 697 cu mm

## 2023-02-18 LAB — AEROBIC/ANAEROBIC CULTURE W GRAM STAIN (SURGICAL/DEEP WOUND)

## 2023-02-18 SURGERY — TOTAL HIP REVISION
Anesthesia: General | Site: Hip | Laterality: Right

## 2023-02-18 MED ORDER — ORAL CARE MOUTH RINSE: 15.0000 mL | Freq: Once | OROMUCOSAL | Status: AC

## 2023-02-18 MED ORDER — PANTOPRAZOLE SODIUM 40 MG PO TBEC
40.0000 mg | DELAYED_RELEASE_TABLET | Freq: Every day | ORAL | Status: DC
  Administered 2023-02-18 – 2023-02-19 (×2): 40 mg via ORAL

## 2023-02-18 MED ORDER — MIDAZOLAM HCL 2 MG/2ML IJ SOLN
INTRAMUSCULAR | Status: DC | PRN
  Administered 2023-02-18: 2 mg via INTRAVENOUS

## 2023-02-18 MED ORDER — SODIUM CHLORIDE (PF) 0.9 % IJ SOLN
INTRAMUSCULAR | Status: DC | PRN
  Administered 2023-02-18: 50 mL via INTRAMUSCULAR

## 2023-02-18 MED ORDER — KETOROLAC TROMETHAMINE 15 MG/ML IJ SOLN
15.0000 mg | Freq: Four times a day (QID) | INTRAMUSCULAR | Status: DC
  Administered 2023-02-18 – 2023-02-19 (×3): 15 mg via INTRAVENOUS

## 2023-02-18 MED ORDER — ROCURONIUM BROMIDE 100 MG/10ML IV SOLN
INTRAVENOUS | Status: DC | PRN
  Administered 2023-02-18: 20 mg via INTRAVENOUS
  Administered 2023-02-18: 10 mg via INTRAVENOUS
  Administered 2023-02-18: 30 mg via INTRAVENOUS
  Administered 2023-02-18: 10 mg via INTRAVENOUS
  Administered 2023-02-18: 20 mg via INTRAVENOUS
  Administered 2023-02-18: 60 mg via INTRAVENOUS
  Administered 2023-02-18: 20 mg via INTRAVENOUS

## 2023-02-18 MED ORDER — HYDROMORPHONE HCL 1 MG/ML IJ SOLN
INTRAMUSCULAR | Status: DC | PRN
  Administered 2023-02-18 (×2): .5 mg via INTRAVENOUS

## 2023-02-18 MED ORDER — DEXAMETHASONE SODIUM PHOSPHATE 10 MG/ML IJ SOLN
INTRAMUSCULAR | Status: AC
  Filled 2023-02-18: qty 1

## 2023-02-18 MED ORDER — CHLORHEXIDINE GLUCONATE 0.12 % MT SOLN
15.0000 mL | Freq: Once | OROMUCOSAL | Status: AC
  Administered 2023-02-18: 15 mL via OROMUCOSAL

## 2023-02-18 MED ORDER — SURGIPHOR WOUND IRRIGATION SYSTEM - OPTIME: TOPICAL | Status: DC | PRN

## 2023-02-18 MED ORDER — OXYCODONE HCL 5 MG PO TABS
5.0000 mg | ORAL_TABLET | Freq: Once | ORAL | Status: AC | PRN
  Administered 2023-02-18: 5 mg via ORAL

## 2023-02-18 MED ORDER — TRANEXAMIC ACID-NACL 1000-0.7 MG/100ML-% IV SOLN
INTRAVENOUS | Status: AC
  Filled 2023-02-18: qty 100

## 2023-02-18 MED ORDER — ENOXAPARIN SODIUM 40 MG/0.4ML IJ SOSY
40.0000 mg | PREFILLED_SYRINGE | INTRAMUSCULAR | Status: DC
  Administered 2023-02-19: 40 mg via SUBCUTANEOUS

## 2023-02-18 MED ORDER — KETOROLAC TROMETHAMINE 15 MG/ML IJ SOLN
INTRAMUSCULAR | Status: AC
  Filled 2023-02-18: qty 1

## 2023-02-18 MED ORDER — ONDANSETRON HCL 4 MG/2ML IJ SOLN
INTRAMUSCULAR | Status: AC
  Filled 2023-02-18: qty 2

## 2023-02-18 MED ORDER — CEFAZOLIN SODIUM-DEXTROSE 2-4 GM/100ML-% IV SOLN
INTRAVENOUS | Status: AC
  Filled 2023-02-18: qty 100

## 2023-02-18 MED ORDER — OXYCODONE HCL 5 MG PO TABS
ORAL_TABLET | ORAL | Status: AC
  Filled 2023-02-18: qty 1

## 2023-02-18 MED ORDER — DOCUSATE SODIUM 100 MG PO CAPS: 100.0000 mg | ORAL_CAPSULE | Freq: Two times a day (BID) | ORAL | 0 refills | Status: AC

## 2023-02-18 MED ORDER — PANTOPRAZOLE SODIUM 40 MG PO TBEC
DELAYED_RELEASE_TABLET | ORAL | Status: AC
  Filled 2023-02-18: qty 1

## 2023-02-18 MED ORDER — LISINOPRIL 20 MG PO TABS
40.0000 mg | ORAL_TABLET | Freq: Every day | ORAL | Status: DC
  Administered 2023-02-18: 40 mg via ORAL

## 2023-02-18 MED ORDER — SODIUM CHLORIDE FLUSH 0.9 % IV SOLN
INTRAVENOUS | Status: AC
  Filled 2023-02-18: qty 10

## 2023-02-18 MED ORDER — CELECOXIB 200 MG PO CAPS: 200.0000 mg | ORAL_CAPSULE | Freq: Two times a day (BID) | ORAL | 0 refills | Status: AC

## 2023-02-18 MED ORDER — DOCUSATE SODIUM 100 MG PO CAPS
ORAL_CAPSULE | ORAL | Status: AC
  Filled 2023-02-18: qty 1

## 2023-02-18 MED ORDER — DOCUSATE SODIUM 100 MG PO CAPS
100.0000 mg | ORAL_CAPSULE | Freq: Two times a day (BID) | ORAL | Status: DC
  Administered 2023-02-18 – 2023-02-19 (×3): 100 mg via ORAL

## 2023-02-18 MED ORDER — ONDANSETRON HCL 4 MG PO TABS: 4.0000 mg | ORAL_TABLET | Freq: Four times a day (QID) | ORAL | Status: DC | PRN

## 2023-02-18 MED ORDER — HEMOSTATIC AGENTS (NO CHARGE) OPTIME
TOPICAL | Status: DC | PRN
  Administered 2023-02-18: 1 via TOPICAL

## 2023-02-18 MED ORDER — ACETAMINOPHEN 500 MG PO TABS: 1000.0000 mg | ORAL_TABLET | Freq: Three times a day (TID) | ORAL | Status: DC

## 2023-02-18 MED ORDER — AMLODIPINE BESYLATE 5 MG PO TABS: 10.0000 mg | ORAL_TABLET | Freq: Every day | ORAL | Status: DC

## 2023-02-18 MED ORDER — FAMOTIDINE 20 MG PO TABS
20.0000 mg | ORAL_TABLET | Freq: Once | ORAL | Status: AC
  Administered 2023-02-18: 20 mg via ORAL

## 2023-02-18 MED ORDER — EPHEDRINE 5 MG/ML INJ
INTRAVENOUS | Status: AC
  Filled 2023-02-18: qty 5

## 2023-02-18 MED ORDER — HYDROCODONE-ACETAMINOPHEN 5-325 MG PO TABS
ORAL_TABLET | ORAL | Status: AC
  Filled 2023-02-18: qty 1

## 2023-02-18 MED ORDER — FAMOTIDINE 20 MG PO TABS
ORAL_TABLET | ORAL | Status: AC
  Filled 2023-02-18: qty 1

## 2023-02-18 MED ORDER — PROPOFOL 10 MG/ML IV BOLUS
INTRAVENOUS | Status: DC | PRN
  Administered 2023-02-18: 200 mg via INTRAVENOUS

## 2023-02-18 MED ORDER — MORPHINE SULFATE (PF) 4 MG/ML IV SOLN
0.5000 mg | INTRAVENOUS | Status: DC | PRN
  Administered 2023-02-18: 1 mg via INTRAVENOUS

## 2023-02-18 MED ORDER — FENTANYL CITRATE (PF) 100 MCG/2ML IJ SOLN
INTRAMUSCULAR | Status: AC
  Filled 2023-02-18: qty 2

## 2023-02-18 MED ORDER — ONDANSETRON HCL 4 MG/2ML IJ SOLN: 4.0000 mg | Freq: Four times a day (QID) | INTRAMUSCULAR | 0 refills | Status: AC | PRN

## 2023-02-18 MED ORDER — FENTANYL CITRATE (PF) 100 MCG/2ML IJ SOLN
INTRAMUSCULAR | Status: DC | PRN
  Administered 2023-02-18 (×2): 50 ug via INTRAVENOUS

## 2023-02-18 MED ORDER — ONDANSETRON HCL 4 MG/2ML IJ SOLN: 4.0000 mg | Freq: Four times a day (QID) | INTRAMUSCULAR | Status: DC | PRN

## 2023-02-18 MED ORDER — METOCLOPRAMIDE HCL 5 MG/ML IJ SOLN: 5.0000 mg | Freq: Three times a day (TID) | INTRAMUSCULAR | Status: DC | PRN

## 2023-02-18 MED ORDER — DEXAMETHASONE SODIUM PHOSPHATE 10 MG/ML IJ SOLN
8.0000 mg | Freq: Once | INTRAMUSCULAR | Status: AC
  Administered 2023-02-18: 8 mg via INTRAVENOUS

## 2023-02-18 MED ORDER — TRANEXAMIC ACID 1000 MG/10ML IV SOLN
INTRAVENOUS | Status: AC
  Filled 2023-02-18: qty 10

## 2023-02-18 MED ORDER — TRANEXAMIC ACID-NACL 1000-0.7 MG/100ML-% IV SOLN
1000.0000 mg | INTRAVENOUS | Status: AC
  Administered 2023-02-18 (×2): 1000 mg via INTRAVENOUS

## 2023-02-18 MED ORDER — MORPHINE SULFATE (PF) 4 MG/ML IV SOLN
INTRAVENOUS | Status: AC
  Filled 2023-02-18: qty 1

## 2023-02-18 MED ORDER — SODIUM CHLORIDE 0.9 % IV SOLN: INTRAVENOUS | Status: DC

## 2023-02-18 MED ORDER — LISINOPRIL 20 MG PO TABS
ORAL_TABLET | ORAL | Status: AC
  Filled 2023-02-18: qty 2

## 2023-02-18 MED ORDER — EPHEDRINE SULFATE (PRESSORS) 50 MG/ML IJ SOLN
INTRAMUSCULAR | Status: DC | PRN
  Administered 2023-02-18: 10 mg via INTRAVENOUS
  Administered 2023-02-18: 5 mg via INTRAVENOUS
  Administered 2023-02-18: 10 mg via INTRAVENOUS

## 2023-02-18 MED ORDER — ACETAMINOPHEN 10 MG/ML IV SOLN
INTRAVENOUS | Status: AC
  Filled 2023-02-18: qty 100

## 2023-02-18 MED ORDER — FENTANYL CITRATE (PF) 100 MCG/2ML IJ SOLN: 25.0000 ug | INTRAMUSCULAR | Status: DC | PRN

## 2023-02-18 MED ORDER — OXYCODONE HCL 5 MG PO TABS
5.0000 mg | ORAL_TABLET | ORAL | Status: DC | PRN
  Administered 2023-02-18 – 2023-02-19 (×2): 5 mg via ORAL

## 2023-02-18 MED ORDER — PHENYLEPHRINE 80 MCG/ML (10ML) SYRINGE FOR IV PUSH (FOR BLOOD PRESSURE SUPPORT)
PREFILLED_SYRINGE | INTRAVENOUS | Status: AC
  Filled 2023-02-18: qty 10

## 2023-02-18 MED ORDER — BUPIVACAINE LIPOSOME 1.3 % IJ SUSP
INTRAMUSCULAR | Status: AC
  Filled 2023-02-18: qty 20

## 2023-02-18 MED ORDER — ORAL CARE MOUTH RINSE: 15.0000 mL | OROMUCOSAL | Status: DC | PRN

## 2023-02-18 MED ORDER — OXYCODONE HCL 5 MG/5ML PO SOLN: 5.0000 mg | Freq: Once | ORAL | Status: AC | PRN

## 2023-02-18 MED ORDER — BUPIVACAINE HCL (PF) 0.25 % IJ SOLN
INTRAMUSCULAR | Status: AC
  Filled 2023-02-18: qty 30

## 2023-02-18 MED ORDER — HYDROMORPHONE HCL 1 MG/ML IJ SOLN
INTRAMUSCULAR | Status: AC
  Filled 2023-02-18: qty 1

## 2023-02-18 MED ORDER — ONDANSETRON HCL 4 MG/2ML IJ SOLN
INTRAMUSCULAR | Status: DC | PRN
  Administered 2023-02-18: 8 mg via INTRAVENOUS

## 2023-02-18 MED ORDER — PHENYLEPHRINE HCL-NACL 20-0.9 MG/250ML-% IV SOLN
INTRAVENOUS | Status: DC | PRN
  Administered 2023-02-18: 26.667 ug/min via INTRAVENOUS

## 2023-02-18 MED ORDER — ACETAMINOPHEN 10 MG/ML IV SOLN
INTRAVENOUS | Status: DC | PRN
  Administered 2023-02-18: 1000 mg via INTRAVENOUS

## 2023-02-18 MED ORDER — HYDROMORPHONE HCL 1 MG/ML IJ SOLN
0.5000 mg | INTRAMUSCULAR | Status: AC | PRN
  Administered 2023-02-18 (×4): 0.5 mg via INTRAVENOUS

## 2023-02-18 MED ORDER — ROCURONIUM BROMIDE 10 MG/ML (PF) SYRINGE
PREFILLED_SYRINGE | INTRAVENOUS | Status: AC
  Filled 2023-02-18: qty 10

## 2023-02-18 MED ORDER — PROPOFOL 10 MG/ML IV BOLUS
INTRAVENOUS | Status: AC
  Filled 2023-02-18: qty 20

## 2023-02-18 MED ORDER — HYDROCODONE-ACETAMINOPHEN 5-325 MG PO TABS
1.0000 | ORAL_TABLET | ORAL | Status: DC | PRN
  Administered 2023-02-18 – 2023-02-19 (×2): 1 via ORAL

## 2023-02-18 MED ORDER — PHENOL 1.4 % MT LIQD: 1.0000 | OROMUCOSAL | Status: DC | PRN

## 2023-02-18 MED ORDER — MIDAZOLAM HCL 2 MG/2ML IJ SOLN
INTRAMUSCULAR | Status: AC
  Filled 2023-02-18: qty 2

## 2023-02-18 MED ORDER — LACTATED RINGERS IV SOLN: INTRAVENOUS | Status: DC

## 2023-02-18 MED ORDER — CHLORHEXIDINE GLUCONATE 0.12 % MT SOLN
OROMUCOSAL | Status: AC
  Filled 2023-02-18: qty 15

## 2023-02-18 MED ORDER — CEFAZOLIN SODIUM-DEXTROSE 2-4 GM/100ML-% IV SOLN
2.0000 g | Freq: Four times a day (QID) | INTRAVENOUS | Status: AC
  Administered 2023-02-18 (×2): 2 g via INTRAVENOUS

## 2023-02-18 MED ORDER — LIDOCAINE HCL (PF) 2 % IJ SOLN
INTRAMUSCULAR | Status: AC
  Filled 2023-02-18: qty 5

## 2023-02-18 MED ORDER — MENTHOL 3 MG MT LOZG: 1.0000 | LOZENGE | OROMUCOSAL | Status: DC | PRN

## 2023-02-18 MED ORDER — SODIUM CHLORIDE 0.9 % IR SOLN
Status: DC | PRN
  Administered 2023-02-18: 250 mL
  Administered 2023-02-18: 300 mL
  Administered 2023-02-18: 3000 mL
  Administered 2023-02-18: 100 mL

## 2023-02-18 MED ORDER — LIDOCAINE HCL (CARDIAC) PF 100 MG/5ML IV SOSY
PREFILLED_SYRINGE | INTRAVENOUS | Status: DC | PRN
  Administered 2023-02-18: 60 mg via INTRAVENOUS

## 2023-02-18 MED ORDER — CEFAZOLIN SODIUM-DEXTROSE 2-4 GM/100ML-% IV SOLN
2.0000 g | INTRAVENOUS | Status: AC
  Administered 2023-02-18: 2 g via INTRAVENOUS

## 2023-02-18 MED ORDER — EPINEPHRINE PF 1 MG/ML IJ SOLN
INTRAMUSCULAR | Status: AC
  Filled 2023-02-18: qty 1

## 2023-02-18 MED ORDER — PHENYLEPHRINE 80 MCG/ML (10ML) SYRINGE FOR IV PUSH (FOR BLOOD PRESSURE SUPPORT)
PREFILLED_SYRINGE | INTRAVENOUS | Status: DC | PRN
  Administered 2023-02-18 (×3): 160 ug via INTRAVENOUS
  Administered 2023-02-18: 80 ug via INTRAVENOUS
  Administered 2023-02-18: 120 ug via INTRAVENOUS
  Administered 2023-02-18: 160 ug via INTRAVENOUS
  Administered 2023-02-18 (×2): 80 ug via INTRAVENOUS
  Administered 2023-02-18: 160 ug via INTRAVENOUS
  Administered 2023-02-18: 80 ug via INTRAVENOUS
  Administered 2023-02-18 (×2): 160 ug via INTRAVENOUS
  Administered 2023-02-18: 120 ug via INTRAVENOUS
  Administered 2023-02-18: 80 ug via INTRAVENOUS
  Administered 2023-02-18: 160 ug via INTRAVENOUS
  Administered 2023-02-18: 80 ug via INTRAVENOUS
  Administered 2023-02-18: 160 ug via INTRAVENOUS
  Administered 2023-02-18: 80 ug via INTRAVENOUS
  Administered 2023-02-18: 160 ug via INTRAVENOUS

## 2023-02-18 MED ORDER — SUGAMMADEX SODIUM 200 MG/2ML IV SOLN
INTRAVENOUS | Status: DC | PRN
  Administered 2023-02-18: 200 mg via INTRAVENOUS

## 2023-02-18 MED ORDER — METOCLOPRAMIDE HCL 5 MG PO TABS: 5.0000 mg | ORAL_TABLET | Freq: Three times a day (TID) | ORAL | Status: DC | PRN

## 2023-02-18 MED ORDER — OXYCODONE HCL 5 MG PO TABS: 2.5000 mg | ORAL_TABLET | ORAL | 0 refills | Status: AC | PRN

## 2023-02-18 MED ORDER — ENOXAPARIN SODIUM 40 MG/0.4ML IJ SOSY: 40.0000 mg | PREFILLED_SYRINGE | INTRAMUSCULAR | 0 refills | Status: AC

## 2023-02-18 SURGICAL SUPPLY — 94 items
ADH SKN CLS APL DERMABOND .7 (GAUZE/BANDAGES/DRESSINGS) ×2
AGENT HMST KT MTR STRL THRMB (HEMOSTASIS) ×1
APL PRP STRL LF DISP 70% ISPRP (MISCELLANEOUS) ×2
BLADE SAGITTAL AGGR TOOTH XLG (BLADE) IMPLANT
BNDG CMPR 5X6 CHSV STRCH STRL (GAUZE/BANDAGES/DRESSINGS) ×1
BNDG COHESIVE 6X5 TAN ST LF (GAUZE/BANDAGES/DRESSINGS) ×1 IMPLANT
BODY FEM MOD RES 25X10 (Stem) IMPLANT
BONE CEMENT GENTAMICIN (Cement) IMPLANT
BOWL CEMENT MIX W/ADAPTER (MISCELLANEOUS) IMPLANT
BUR 4X45 EGG (BURR) IMPLANT
CHLORAPREP W/TINT 26 (MISCELLANEOUS) ×2 IMPLANT
CNTNR URN SCR LID CUP LEK RST (MISCELLANEOUS) IMPLANT
CONT SPEC 4OZ STRL OR WHT (MISCELLANEOUS) ×3
COVER BACK TABLE REUSABLE LG (DRAPES) ×1 IMPLANT
COVER SET STULBERG POSITIONER (MISCELLANEOUS) ×1 IMPLANT
CUP MEDICINE 2OZ PLAST GRAD ST (MISCELLANEOUS) IMPLANT
DERMABOND ADVANCED .7 DNX12 (GAUZE/BANDAGES/DRESSINGS) IMPLANT
DRAPE 3/4 80X56 (DRAPES) ×1 IMPLANT
DRAPE IMP U-DRAPE 54X76 (DRAPES) ×1 IMPLANT
DRAPE INCISE IOBAN 66X60 STRL (DRAPES) ×1 IMPLANT
DRAPE U-SHAPE 47X51 STRL (DRAPES) ×1 IMPLANT
DRAPE XRAY CASSETTE 23X24 (DRAPES) IMPLANT
DRSG MEPILEX SACRM 8.7X9.8 (GAUZE/BANDAGES/DRESSINGS) ×1 IMPLANT
DRSG OPSITE POSTOP 4X12 (GAUZE/BANDAGES/DRESSINGS) IMPLANT
DRSG OPSITE POSTOP 4X14 (GAUZE/BANDAGES/DRESSINGS) ×1 IMPLANT
ELECT REM PT RETURN 9FT ADLT (ELECTROSURGICAL) ×1
ELECTRODE REM PT RTRN 9FT ADLT (ELECTROSURGICAL) ×1 IMPLANT
GLOVE BIO SURGEON STRL SZ8 (GLOVE) ×1 IMPLANT
GLOVE BIOGEL PI IND STRL 8 (GLOVE) ×1 IMPLANT
GLOVE PI ORTHO PRO STRL 7.5 (GLOVE) ×3 IMPLANT
GLOVE PI ORTHO PRO STRL SZ8 (GLOVE) ×3 IMPLANT
GLOVE SURG SYN 7.5  E (GLOVE) ×1
GLOVE SURG SYN 7.5 E (GLOVE) ×1 IMPLANT
GLOVE SURG SYN 7.5 PF PI (GLOVE) ×1 IMPLANT
GOWN SRG XL LVL 3 NONREINFORCE (GOWNS) ×1 IMPLANT
GOWN STRL NON-REIN TWL XL LVL3 (GOWNS) ×1
GOWN STRL REUS W/ TWL LRG LVL3 (GOWN DISPOSABLE) ×1 IMPLANT
GOWN STRL REUS W/ TWL XL LVL3 (GOWN DISPOSABLE) ×1 IMPLANT
GOWN STRL REUS W/TWL LRG LVL3 (GOWN DISPOSABLE) ×1
GOWN STRL REUS W/TWL XL LVL3 (GOWN DISPOSABLE) ×1
HANDLE YANKAUER SUCT OPEN TIP (MISCELLANEOUS) ×1 IMPLANT
HEAD CERAMIC FEMORAL 36MM (Head) IMPLANT
HEMOVAC 400CC 10FR (MISCELLANEOUS) IMPLANT
HOLDER FOLEY CATH W/STRAP (MISCELLANEOUS) ×1 IMPLANT
HOOD PEEL AWAY T7 (MISCELLANEOUS) ×2 IMPLANT
INSERT TRIDENT POLY E 36OD (Insert) IMPLANT
IV NS 100ML SINGLE PACK (IV SOLUTION) ×1 IMPLANT
IV NS 250ML (IV SOLUTION) ×1
IV NS 250ML BAXH (IV SOLUTION) IMPLANT
IV NS IRRIG 3000ML ARTHROMATIC (IV SOLUTION) ×1 IMPLANT
KIT STIMULAN RAPID CURE 5CC (Orthopedic Implant) IMPLANT
KIT TURNOVER KIT A (KITS) ×1 IMPLANT
MANIFOLD NEPTUNE II (INSTRUMENTS) ×1 IMPLANT
MARKER SKIN DUAL TIP RULER LAB (MISCELLANEOUS) IMPLANT
MAT ABSORB  FLUID 56X50 GRAY (MISCELLANEOUS) ×1
MAT ABSORB FLUID 56X50 GRAY (MISCELLANEOUS) ×1 IMPLANT
NDL SPNL 20GX3.5 QUINCKE YW (NEEDLE) IMPLANT
NEEDLE SPNL 20GX3.5 QUINCKE YW (NEEDLE) IMPLANT
NS IRRIG 500ML POUR BTL (IV SOLUTION) ×1 IMPLANT
PACK HIP PROSTHESIS (MISCELLANEOUS) ×1 IMPLANT
PENCIL SMOKE EVACUATOR (MISCELLANEOUS) ×1 IMPLANT
PILLOW ABDUCTION FOAM SM (MISCELLANEOUS) IMPLANT
PULSAVAC PLUS IRRIG FAN TIP (DISPOSABLE) ×1
RETRIEVER SUT HEWSON (MISCELLANEOUS) IMPLANT
SCREW HEX LP 6.5X15 (Screw) IMPLANT
SCREW HEX LP 6.5X20 (Screw) IMPLANT
SCREW HEX LP 6.5X40 (Screw) IMPLANT
SHELL MULTIHOLE ACETABULAR 52E (Miscellaneous) IMPLANT
SLEEVE SCD COMPRESS KNEE MED (STOCKING) ×1 IMPLANT
SOLUTION IRRIG SURGIPHOR (IV SOLUTION) ×1 IMPLANT
SPONGE T-LAP 18X18 ~~LOC~~+RFID (SPONGE) IMPLANT
STEM FEM RESTORATION 15X155 (Stem) IMPLANT
SURGIFLO W/THROMBIN 8M KIT (HEMOSTASIS) IMPLANT
SUT BONE WAX W31G (SUTURE) IMPLANT
SUT DVC 2 QUILL PDO  T11 36X36 (SUTURE) ×1
SUT DVC 2 QUILL PDO T11 36X36 (SUTURE) IMPLANT
SUT ETHIBOND #5 BRAIDED 30INL (SUTURE) IMPLANT
SUT PDS AB 0 CT1 27 (SUTURE) IMPLANT
SUT PDS AB 1 CT1 27 (SUTURE) IMPLANT
SUT PROLENE 2 0 SH DA (SUTURE) IMPLANT
SUT QUILL MONODERM 3-0 PS-2 (SUTURE) IMPLANT
SUT VIC AB 0 CT1 36 (SUTURE) IMPLANT
SUT VIC AB 2-0 CT2 27 (SUTURE) IMPLANT
SUT VICRYL 1-0 27IN ABS (SUTURE) ×1
SUTURE VICRYL 1-0 27IN ABS (SUTURE) IMPLANT
SWAB CULTURE AMIES ANAERIB BLU (MISCELLANEOUS) IMPLANT
SYR 10ML LL (SYRINGE) ×1 IMPLANT
SYR 30ML LL (SYRINGE) IMPLANT
TIP FAN IRRIG PULSAVAC PLUS (DISPOSABLE) ×1 IMPLANT
TOWEL OR 17X26 4PK STRL BLUE (TOWEL DISPOSABLE) IMPLANT
TRAP FLUID SMOKE EVACUATOR (MISCELLANEOUS) ×1 IMPLANT
TRAY FOLEY SLVR 16FR LF STAT (SET/KITS/TRAYS/PACK) ×2 IMPLANT
WAND WEREWOLF FASTSEAL 6.0 (MISCELLANEOUS) ×1 IMPLANT
WATER STERILE IRR 1000ML POUR (IV SOLUTION) ×1 IMPLANT

## 2023-02-18 NOTE — Op Note (Signed)
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   Procedure: Right hip revision total hip arthrplasty, both components  Components/Implants: Cup Trident Multi-hole 52/E w/x4 screws    Liner: X3 10 degree lipped 36/E  Stem: Restoration modular 72mm/+10 proximal body w/ 155x82mm stem  Head: 36mm +0 ceramic biolox  Date of Surgery: 02/18/2023  Surgeon: Reinaldo Berber MD  Assistant: Amador Cunas PA (present and scrubbed throughout the case, critical for assistance with exposure, retraction, instrumentation, and closure)   Anesthesiologist: Lorette Ang  Anesthesia: General   IVF:2000cc  EBL: 600cc  Complications: None   Brief history: The patient is a 52 year old male with a history of right total hip replacement who has had persistent pain limiting their range of motion and activities of daily living and his right hip with palpable clunking and severe discomfort with daily activities.  The hip was worked up for infection and found to be low concern for infection with evidence of osteolysis on CT scan.  The risks and benefits of revision total hip arthroplasty as definitive surgical treatment were discussed with the patient who opted to proceed with the operation.  After outpatient medical clearance and optimization was completed the patient was admitted to [facility] for the procedure.  All preoperative films were reviewed and an appropriate surgical plan was made prior to surgery.   Description of procedure: The patient was brought to the operating room where laterality was confirmed by all those present to be the right side.  The patient was moved to the table and administered general anesthesia. Patient was given  an intravenous dose of antibiotics for surgical prophylaxis and TXA. The patient was positioned in lateral decubitus position with all bony prominences well-padded.  Surgical site was prepped with alcohol and chlorhexidine.  Surgical site over the hip was draped in typical sterile fashion with multiple layers of adhesive and nonadhesive drapes.  The incision site over the greater trochanter posteriorly was marked out with a sterile marker.   Surgical timeout was then called with participation of all staff in the room the patient was confirmed and laterality again confirmed.  An incision was made over the lateral aspect of the hip cheating posteriorly on the proximal aspect.  Careful soft tissue dissection and coagulation of all bleeders was carried out down to the level of the glut max fascia.  The fascia was carefully incised in line with the femur.  A Charnley retractor was placed deep to the fascia with care taken to ensure that there was no nerve entrapment in the retractor.  The bursa tissue was taken down over the posterior femur exposing the external rotators.  A dull Cobra retractor was placed under the abductor mechanism to protect the mechanism and fully expose the piriformis and short external rotators.  The external rotators were carefully detached from the femur with electrocautery and tagged with Ethibond sutures.  The hip capsule was then aspirated with a sterile needle and the fluid appeared clear which was sent to the lab for stat cell count.  Cell count came back at 697 cells with only 24% neutrophils reassuring that this was not an infected hip.  The capsule to the hip was incised and tagged with sutures.  Upon entering the joint space a large amount of normal-appearing synovial fluid and a significant amount  of black hypertrophied synovial tissue was encountered wrapping around the femoral component and acetabulum.  Electrocautery was used to carefully dissect out the excess synovial tissue and  a acetabular sided sample and a femoral sided sample were sent to the lab separately for culture.  After a complete synovectomy around the proximal femur and the acetabular component as best as was visible from a predislocation position.  The hip was then carefully dislocated.  A tamp was used to attempt to dissociate the dual mobility femoral head and upon tapping the entire femoral component fell out of the femur.  It was surrounded by a several millimeter thick coating of fibrous black tissue which came out of the canal is 1 large piece.  Piece of this tissue was added to the femoral synovial culture.  There was no evidence of any bony ingrowth on the femoral component.  The femoral neck of the component was found to have eroded through almost 50% of the metal likely due to articulation with the acetabular component.  After carefully removing all the excess soft tissue around the proximal femur our attention was then turned to the acetabulum.  All of the anterior soft tissue on the rim of the acetabular component was removed fully exposing the acetabular component 360 degrees.  Sequential acetabular cup cutters were used to carefully worked the interface between the acetabular component and the bone and after a few turns of the cup cutters the acetabulum fell out without any bone loss.   The hip was then irrigated both femur and acetabulum with a few liters of normal saline.  Excess bony overgrowth along the edges of the prior acetabular component were carefully resected and a reamer was used to sequentially ream up into a more medial and superior position with good fit and fill with a 52 mm reamer.  A 52 mm cup was then placed impacted into position and 4 screws were placed around the rim to secure it in place.  The cup was found to be very secure without any motion and well-seated in the acetabulum.  A trial liner was then placed and attention was moved to the femur.  The femoral canal was sequentially  reamed up to a size 15 mm reamer which engaged strongly with the femoral canal.  A sterile flatplate x-ray was then taken showing good central location of the reamer without any penetration and good fill.  The reamer was then removed and a real distal 155 mm x 15 mm stem was implanted with good fixation.  A proximal body attachment reaming core was placed and the proximal body was reamed up to a size 25 mm diameter.  A trial 25 mm +10 mm proximal body was placed and a trial head was placed.  The hip was reduced and taken through range of motion.  The hip was found to be stable without any component component impingement with stability found in sleeper position and with max hip flexion to 90 degrees up to 75 degrees of internal rotation for bony impingement of the trochanter on the anterior column.  Leg lungs were found to be equal at this time so the hip was carefully dislocated the temporary liner and proximal body were removed.  Acetabulum was carefully irrigated and cleaned and a real 10 degree lipped liner was placed impacted and checked for stability.  Attention then turned back to the femur and a real proximal body +10 mm x 25 mm was impacted into place with approximately 15 to 20  degrees of anteversion and secured with a tensioned screw.  A trial ball was then placed and the hip was taken back to the range of motion and found to be stable as noted above.  The hip was dislocated the ball was removed the trunnion was cleaned and a real ceramic ball was impacted into place and checked for stability.  The hip was reduced and taken through range of motion and found to be stable with good leg lengths.  The hip was then irrigated with Betadine based surgery for wash followed by another few liters of normal saline.  A 2 mm drill bit was then used to make 2 holes in the greater trochanter the piriformis and capsular tissues were reapproximated and passed through the drill holes and tied over the greater trochanter.   The pericapsular and subcutaneous tissues were injected with an Exparel based cocktail.  The fascia was then approximated with #1 Vicryl and #2 barbed suture.  The subcutaneous tissues and skin were closed with 0 Vicryl 2-0 Vicryl and 3-0 V lock suture and the skin closed with Dermabond.  A sterile dressing was then applied.  Lap, sharps, and sponge counts were correct at the end of the case.   The patient was then rolled supine and an x-ray was taken in the operating room with the operating room table kept Sterile. Leg lengths were clinically equal on examination with a good distal pulse. components appeared in good position with no fractures noted on x-ray.  Patient was then transferred to a hospital bed and transferred to the recovery room in stable condition.

## 2023-02-18 NOTE — Anesthesia Preprocedure Evaluation (Signed)
Anesthesia Evaluation  Patient identified by MRN, date of birth, ID band Patient awake    Reviewed: Allergy & Precautions, NPO status , Patient's Chart, lab work & pertinent test results  Airway Mallampati: II  TM Distance: >3 FB Neck ROM: full    Dental no notable dental hx. (+) Dental Advidsory Given, Chipped   Pulmonary neg shortness of breath, neg COPD, Patient abstained from smoking., former smoker   Pulmonary exam normal        Cardiovascular hypertension, On Medications Normal cardiovascular exam     Neuro/Psych negative neurological ROS  negative psych ROS   GI/Hepatic negative GI ROS, Neg liver ROS,,,  Endo/Other  negative endocrine ROS    Renal/GU negative Renal ROS  negative genitourinary   Musculoskeletal   Abdominal   Peds  Hematology negative hematology ROS (+)   Anesthesia Other Findings Discussed the case with Dr. Audelia Acton and he stated that the case will likely run over 3 hours. Agreed to go with a general ETT from the beginning.   Past Medical History: No date: Arthritis No date: History of chicken pox No date: Hypertension No date: Palpitations No date: Substance abuse [facility])  Past Surgical History: No date: JOINT REPLACEMENT 05/14/2015: TOTAL HIP ARTHROPLASTY; Left     Comment:  Procedure: TOTAL HIP ARTHROPLASTY ANTERIOR APPROACH;                Surgeon: Kennedy Bucker, MD;  Location: [facility] ORS;  Service:              Orthopedics;  Laterality: Left; 11/24/2016: TOTAL HIP ARTHROPLASTY; Right     Comment:  Procedure: TOTAL HIP ARTHROPLASTY ANTERIOR APPROACH;                Surgeon: Kennedy Bucker, MD;  Location: [facility] ORS;  Service:              Orthopedics;  Laterality: Right;  BMI    Body Mass Index: 26.50 kg/m      Reproductive/Obstetrics negative OB ROS                             Anesthesia Physical Anesthesia Plan  ASA: 2  Anesthesia Plan: General    Post-op Pain Management: Minimal or no pain anticipated and Dilaudid IV   Induction: Intravenous  PONV Risk Score and Plan: 3 and Midazolam, Ondansetron and Dexamethasone  Airway Management Planned: Oral ETT  Additional Equipment:   Intra-op Plan:   Post-operative Plan: Extubation in OR  Informed Consent: I have reviewed the patients History and Physical, chart, labs and discussed the procedure including the risks, benefits and alternatives for the proposed anesthesia with the patient or authorized representative who has indicated his/her understanding and acceptance.     Dental Advisory Given  Plan Discussed with: Anesthesiologist, CRNA and Surgeon  Anesthesia Plan Comments: (Patient consented for risks of anesthesia including but not limited to:  - adverse reactions to medications - damage to eyes, teeth, lips or other oral mucosa - nerve damage due to positioning  - sore throat or hoarseness - Damage to heart, brain, nerves, lungs, other parts of body or loss of life  Patient voiced understanding.)       Anesthesia Quick Evaluation

## 2023-02-18 NOTE — Anesthesia Procedure Notes (Signed)
Procedure Name: Intubation Date/Time: 02/18/2023 7:41 AM  Performed by: Jeannene Patella, CRNAPre-anesthesia Checklist: Patient identified, Emergency Drugs available, Suction available, Patient being monitored and Timeout performed Patient Re-evaluated:Patient Re-evaluated prior to induction Oxygen Delivery Method: Circle system utilized Preoxygenation: Pre-oxygenation with 100% oxygen Induction Type: IV induction Ventilation: Mask ventilation with difficulty and Oral airway inserted - appropriate to patient size Laryngoscope Size: McGraph and 4 Grade View: Grade III Tube type: Oral Tube size: 7.5 mm Number of attempts: 1 Airway Equipment and Method: Stylet Placement Confirmation: ETT inserted through vocal cords under direct vision, positive ETCO2 and breath sounds checked- equal and bilateral Secured at: 23 (at teeth) cm Tube secured with: Tape Dental Injury: Teeth and Oropharynx as per pre-operative assessment  Comments: Able to ventilate with oral airway 90 mm in place, DL grade 3 view improved to grade 2 with firm cricoid pressure

## 2023-02-18 NOTE — Interval H&P Note (Signed)
Patient history and physical updated. Consent reviewed including risks, benefits, and alternatives to surgery. Patient agrees with above plan to proceed with right total hip complete revision.

## 2023-02-18 NOTE — Evaluation (Signed)
Physical Therapy Evaluation Patient Details Name: STEFFEN HASE MRN: [id number] DOB: [birth year] Today's Date: 02/18/2023  History of Present Illness  Ilian Wessell is a 51yoM who comes to [facility] on 02/18/23 for elective Rt hip joint revision, First THA in 2018, hardware no longer functioning as advertised. PTA pt works, drives, lives alone, was at the gym just last week.  Clinical Impression  Pt in Preop19, cleared by RN for PT evaluation. Pt reports pain at goal, he is eager to begin mobilizing, has had 2 prior anterior THA, but not a posterior. Discussed precautions and weight bearing, started some HEP education, pt demonstrates bed mobility, transfers, and AMB, all of which are opportunities to discussed activity specific considerations for his precautions. He seems to like sitting up erect a lot which makes avoiding 90 degrees of flexion a bit tricky. He warns Chartered loss adjuster that he is a notorious leg crosser. Pt up to chair at EOS, RN at bed side. Will go over stairs navigation next day, albeit he was already using recommended sequencing prior to surgery. Will continue to follow.      Recommendations for follow up therapy are one component of a multi-disciplinary discharge planning process, led by the attending physician.  Recommendations may be updated based on patient status, additional functional criteria and insurance authorization.  Follow Up Recommendations       Assistance Recommended at Discharge PRN  Patient can return home with the following  Assist for transportation;Assistance with cooking/housework;Help with stairs or ramp for entrance    Equipment Recommendations None recommended by PT  Recommendations for Other Services       Functional Status Assessment Patient has had a recent decline in their functional status and demonstrates the ability to make significant improvements in function in a reasonable and predictable amount of time.     Precautions / Restrictions  Precautions Precautions: Posterior Hip Precaution Booklet Issued: Yes (comment) Restrictions Weight Bearing Restrictions: Yes RLE Weight Bearing: Weight bearing as tolerated      Mobility  Bed Mobility Overal bed mobility: Modified Independent             General bed mobility comments: extensive cues for hip precautions    Transfers Overall transfer level: Needs assistance Equipment used: Rolling walker (2 wheels) Transfers: Sit to/from Stand Sit to Stand: Supervision           General transfer comment: extensive cues for hip precautions    Ambulation/Gait Ambulation/Gait assistance: Supervision, Min guard Gait Distance (Feet): 200 Feet Assistive device: Rolling walker (2 wheels) Gait Pattern/deviations: Step-through pattern, WFL(Within Functional Limits) Gait velocity: pretty good!     General Gait Details: reluctant to use RW very much, but extensive education on need to focus on gait retraining and avoid excessive antalgic gait if not necessary  Stairs Stairs:  (defer to POD1)          Wheelchair Mobility    Modified Rankin (Stroke Patients Only)       Balance                                             Pertinent Vitals/Pain Pain Assessment Pain Assessment: 0-10 Pain Score: 4  Pain Location: surgical hip Pain Descriptors / Indicators: Aching Pain Intervention(s): Limited activity within patient's tolerance, Monitored during session, Premedicated before session    Home Living Family/patient expects to be discharged to:: Private residence  Living Arrangements: Alone   Type of Home: House Home Access: Stairs to enter Entrance Stairs-Rails: Right;Left;Can reach both Entrance Stairs-Number of Steps: 4   Home Layout: One level Home Equipment: Agricultural consultant (2 wheels);Cane - single point;Crutches      Prior Function Prior Level of Function : Independent/Modified Independent                     Hand Dominance         Extremity/Trunk Assessment                Communication      Cognition Arousal/Alertness: Awake/alert Behavior During Therapy: WFL for tasks assessed/performed Overall Cognitive Status: Within Functional Limits for tasks assessed                                          General Comments      Exercises Total Joint Exercises Ankle Circles/Pumps: AROM, Right, 10 reps, Supine Heel Slides: AAROM, Right, 10 reps, Supine Hip ABduction/ADduction: AAROM, Right, 10 reps, Supine Goniometric ROM: easily tolerates up to 90 degree hip flexion, encouraged to pump the breaks and try to use 70 degrees as a limit goal during functional movements   Assessment/Plan    PT Assessment Patient needs continued PT services  PT Problem List Decreased strength;Decreased range of motion;Decreased activity tolerance;Decreased balance;Decreased mobility;Decreased coordination;Decreased cognition;Decreased safety awareness;Decreased knowledge of precautions;Cardiopulmonary status limiting activity       PT Treatment Interventions DME instruction;Gait training;Stair training;Functional mobility training;Therapeutic activities;Therapeutic exercise;Balance training;Neuromuscular re-education;Patient/family education    PT Goals (Current goals can be found in the Care Plan section)  Acute Rehab PT Goals Patient Stated Goal: regain strength return to gym acitivty PT Goal Formulation: With patient Time For Goal Achievement: 03/04/23 Potential to Achieve Goals: Good    Frequency BID     Co-evaluation               AM-PAC PT "6 Clicks" Mobility  Outcome Measure Help needed turning from your back to your side while in a flat bed without using bedrails?: A Little Help needed moving from lying on your back to sitting on the side of a flat bed without using bedrails?: A Little Help needed moving to and from a bed to a chair (including a wheelchair)?: A Little Help needed  standing up from a chair using your arms (e.g., wheelchair or bedside chair)?: A Little Help needed to walk in hospital room?: A Little Help needed climbing 3-5 steps with a railing? : A Little 6 Click Score: 18    End of Session Equipment Utilized During Treatment: Gait belt Activity Tolerance: Patient tolerated treatment well;No increased pain Patient left: in chair;with call bell/phone within reach;with nursing/sitter in room Nurse Communication: Mobility status PT Visit Diagnosis: Other abnormalities of gait and mobility (R26.89);Muscle weakness (generalized) (M62.81)    Time: 5409-8119 PT Time Calculation (min) (ACUTE ONLY): 30 min   Charges:   PT Evaluation $PT Eval Moderate Complexity: 1 Mod PT Treatments $Therapeutic Exercise: 8-22 mins $Therapeutic Activity: 8-22 mins       4:38 PM, 02/18/23 Rosamaria Lints, PT, DPT Physical Therapist - [facility]  [phone] (ASCOM)    Genavie Boettger C 02/18/2023, 4:35 PM

## 2023-02-18 NOTE — Discharge Instructions (Signed)
Instructions after Posterior Total Hip Revision        Dr. Regenia Skeeter., M.D.      Dept. of Orthopaedics & Sports Medicine  [facility]  [address]  [place], [place]  [address]  Phone: [phone]   Fax: [phone]    DIET: Drink plenty of non-alcoholic fluids. Resume your normal diet. Include foods high in fiber.  ACTIVITY:  You may use crutches or a walker with weight-bearing as tolerated, unless instructed otherwise. You may be weaned off of the walker or crutches by your Physical Therapist.  Do NOT reach below the level of your knees or cross your legs until allowed.    Continue doing gentle exercises. Exercising will reduce the pain and swelling, increase motion, and prevent muscle weakness.   Please continue to use the TED compression stockings for 2 weeks. You may remove the stockings at night, but should reapply them in the morning. Do not drive or operate any equipment until instructed.  WOUND CARE:  Continue to use ice packs periodically to reduce pain and swelling. You may shower with honeycomb dressing 3 days after surgery. Do not submerge incision site under water. Remove honeycomb dressing 7 days after surgery and allow dermabond to fall off on its own.   MEDICATIONS: You may resume your regular medications. Please take the pain medication as prescribed on the medication list. Do not take pain medication on an empty stomach. You have been given a prescription for a blood thinner to prevent blood clots. Please take the medication as instructed. (NOTE: After completing a 2 week course of Lovenox, take one Enteric-coated 81 mg aspirin twice a day for 3 additional weeks.) Pain medications and iron supplements can cause constipation. Use a stool softener (Senokot or Colace) on a daily basis and a laxative (dulcolax or miralax) as needed. Do not drive or drink alcoholic beverages when taking pain medications.   POSTOPERATIVE CONSTIPATION  PROTOCOL Constipation - defined medically as fewer than three stools per week and severe constipation as less than one stool per week.  One of the most common issues patients have following surgery is constipation.  Even if you have a regular bowel pattern at home, your normal regimen is likely to be disrupted due to multiple reasons following surgery.  Combination of anesthesia, postoperative narcotics, change in appetite and fluid intake all can affect your bowels.  In order to avoid complications following surgery, here are some recommendations in order to help you during your recovery period.  Colace (docusate) - Pick up an over-the-counter form of Colace or another stool softener and take twice a day as long as you are requiring postoperative pain medications.  Take with a full glass of water daily.  If you experience loose stools or diarrhea, hold the colace until you stool forms back up.  If your symptoms do not get better within 1 week or if they get worse, check with your doctor.  Dulcolax (bisacodyl) - Pick up over-the-counter and take as directed by the product packaging as needed to assist with the movement of your bowels.  Take with a full glass of water.  Use this product as needed if not relieved by Colace only.   MiraLax (polyethylene glycol) - Pick up over-the-counter to have on hand.  MiraLax is a solution that will increase the amount of water in your bowels to assist with bowel movements.  Take as directed and can mix with a glass of water, juice, soda, coffee, or tea.  Take if you go more than two days without a movement. Do not use MiraLax more than once per day. Call your doctor if you are still constipated or irregular after using this medication for 7 days in a row.  If you continue to have problems with postoperative constipation, please contact the office for further assistance and recommendations.  If you experience "the worst abdominal pain ever" or develop nausea or  vomiting, please contact the office immediatly for further recommendations for treatment.   CALL THE OFFICE FOR: Temperature above 101 degrees Excessive bleeding or drainage on the dressing. Excessive swelling, coldness, or paleness of the toes. Persistent nausea and vomiting.  FOLLOW-UP:  You should have an appointment to return to the office in 2 weeks after surgery. Arrangements have been made for continuation of Physical Therapy (either home therapy or outpatient therapy).

## 2023-02-18 NOTE — Plan of Care (Signed)
  Problem: Activity: Goal: Ability to tolerate increased activity will improve Outcome: Progressing   Problem: Pain Management: Goal: Pain level will decrease with appropriate interventions Outcome: Progressing   Problem: Skin Integrity: Goal: Will show signs of wound healing Outcome: Progressing

## 2023-02-18 NOTE — Transfer of Care (Signed)
Immediate Anesthesia Transfer of Care Note  Patient: Angel Bautista  Procedure(s) Performed: Right total hip revision, both components (Right: Hip)  Patient Location: PACU  Anesthesia Type:General  Level of Consciousness: awake, alert , oriented, and patient cooperative  Airway & Oxygen Therapy: Patient Spontanous Breathing and Patient connected to face mask oxygen  Post-op Assessment: Report given to RN and Post -op Vital signs reviewed and stable  Post vital signs: Reviewed and stable  Last Vitals:  Vitals Value Taken Time  BP 133/72 02/18/23 1203  Temp 36.7 C 02/18/23 1203  Pulse 98 02/18/23 1206  Resp 19 02/18/23 1206  SpO2 100 % 02/18/23 1206  Vitals shown include unvalidated device data.  Last Pain:  Vitals:   02/18/23 0620  TempSrc: Temporal  PainSc: 7          Complications: No notable events documented.

## 2023-02-18 NOTE — Anesthesia Postprocedure Evaluation (Signed)
Anesthesia Post Note  Patient: Angel Bautista  Procedure(s) Performed: Right total hip revision, both components (Right: Hip)  Patient location during evaluation: PACU Anesthesia Type: General Level of consciousness: awake and alert Pain management: pain level controlled Vital Signs Assessment: post-procedure vital signs reviewed and stable Respiratory status: spontaneous breathing, nonlabored ventilation, respiratory function stable and patient connected to nasal cannula oxygen Cardiovascular status: blood pressure returned to baseline and stable Postop Assessment: no apparent nausea or vomiting Anesthetic complications: no  No notable events documented.   Last Vitals:  Vitals:   02/18/23 1245 02/18/23 1300  BP: 119/68 134/81  Pulse: 87 99  Resp: 13 (!) 22  Temp:    SpO2: 95% 97%    Last Pain:  Vitals:   02/18/23 1300  TempSrc:   PainSc: 6                  Stephanie Coup

## 2023-02-18 NOTE — H&P (Signed)
History of Present Illness: Angel Bautista is an 52 y.o. male presents for follow-up evaluation of his right hip. The patient has had persistent pain and sensations of instability around his right hip localized to his groin and anterior thigh. He reports a clunking sensation with certain motions and severe anterior pain with walking going up and down and exercise. He is no longer able to squat due to severe pain in his right groin. He describes his pain as a dull ache with sharp stabs up to a 10 out of 10 which has not improved over the last year. He has been treated with physical training home exercise regimen, anti-inflammatories including Celebrex without any improvement. His hip is stopping him from performing activities of daily living and working at this point. His inflammatory markers were negative and bone scan and CT scan show osteolysis around the femoral component with a vertical cup. The patient denies fevers, chills, numbness, tingling, shortness of breath, chest pain, recent illness, or any trauma.  History of LTHA 2016, RTHA 2018,  Right Hip Medacta 56 mm cup a 28 mm head with a size 3 lateralized a MIS stem.  ESR/CRP levels normal \\Cobalt /Chrome levels normal  Past Medical History: Past Medical History:  Diagnosis Date  Chickenpox  Hypertension   Past Surgical History: Past Surgical History:  Procedure Laterality Date  Total hip arthroplasty anterior approach Left 05/14/15  total hip arthroplasty anterior approach Right 11/24/2016  Dr.Menz   Past Family History: Family History  Problem Relation Age of Onset  No Known Problems Mother  High blood pressure (Hypertension) Father  Alcohol abuse Father  Stroke Father  Prostate cancer Father  Cervical cancer Sister   Medications: Current Outpatient Medications Ordered in Epic  Medication Sig Dispense Refill  amLODIPine (NORVASC) 10 MG tablet Take 1 tablet by mouth once daily  celecoxib (CELEBREX) 200 MG capsule Take  200 mg by mouth once daily  HYDROcodone-acetaminophen (NORCO) 5-325 mg tablet Take 1 tablet every 6 hours by oral route as needed.  lisinopriL (ZESTRIL) 40 MG tablet Take 1 tablet by mouth once daily  sildenafil (REVATIO) 20 mg tablet Take 1-4 tablets by mouth as directed  valACYclovir (VALTREX) 500 MG tablet Take 2 tablets by mouth as directed   No current Epic-ordered facility-administered medications on file.   Allergies: Allergies  Allergen Reactions  Tramadol Itching  Penicillin Swelling and Rash    Visit Vitals: There were no vitals filed for this visit.   Review of Systems:  A comprehensive 14 point ROS was performed, reviewed, and the pertinent orthopaedic findings are documented in the HPI.  Physical Exam: There is no height or weight on file to calculate BMI. General/Constitutional: No apparent distress: well-nourished and well developed. Lymphatic: No palpable adenopathy. Pulmonary exam: Lungs clear to auscultation bilaterally no wheezing rales or rhonchi Cardiac exam: Regular rate and rhythm no obvious murmurs rubs or gallops. Vascular: No edema, swelling or tenderness, except as noted in detailed exam. Integumentary: No impressive skin lesions present, except as noted in detailed exam. Neuro/Psych: Normal mood and affect, oriented to person, place and time. Musculoskeletal: Normal, except as noted in detailed exam and in HPI.  Right hip exam  SKIN: intact with a well-healed anterior incision SWELLING: none WARMTH: no warmth TENDERNESS: none, Stinchfield Positive ROM: 10 degrees internal rotation and 30 degrees external rotation and pain with internal rotation, localized to the groin; Hip Flexion 95 with pain to resisted hip flexion When patient laid supine with legs extended internal/external rotation  of the leg leads to a palpable clunk and near subluxation event of the hip with corresponding reproduction of pain STRENGTH: normal GAIT: normal and  stiff-legged STABILITY: unstable in extension and external rotation with a palpable clunk CREPITUS: no LEG LENGTH DISCREPANCY: left longer by .4 cm NEUROLOGICAL EXAM: normal VASCULAR EXAM: normal LUMBAR SPINE: tenderness: no straight leg raising sign: no motor exam: normal  The contralateral hip was examined for comparison and it showed: TENDERNESS: none ROM: normal and full STRENGTH: normal STABILITY: stable to testing  Hip Imaging :  I have reviewed AP pelvis and lateral hip X-rays (2 views) taken today in the office of the right hip which reveal status post bilateral total hip arthroplasty. The right hip shows a vertical cup with a low position. The ball appears eccentrically positioned superiorly in the right Which appears more eccentric than on postoperative x-rays taken in 2018. There is significant osteolysis around the proximal femoral component consistent with CT scan reviewed below. The left hip shows components in appropriate position there is some periacetabular cystic changes superior to the cup no osteolysis noted around the femoral stem on the left. No fractures or dislocations noted.   CT scan of the right hip performed 01/25/2023 in concert with a SPECT three-phase bone scan images and report reviewed by myself. There is extensive osteolysis around the right proximal femoral component extending down to the distal third proximal two thirds junction circumferentially with no periprosthetic fractures noted. The femoral head does appear eccentrically positioned superiorly within the cup there is minimal effusion or osteolysis around the cup component. Adequate bone stock remaining in the superior acetabulum and medially in the pelvis.  Three-phase bone scan does not show any significant uptake although there is some asymmetric minimal signal and noted around the femoral component no obvious signs of component loosening at this time.  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  Plan: Angel Bautista is a 53 year old male with a painful right total hip with evidence of PERI implant osteolysis and palpable subluxation on the physical exam consistent with poly wear in the setting of a fairly vertical cup. The patient has not had improvement with conservative measures physical training and anti-inflammatories. Given his reproducible pain on exam and negative inflammatory markers I feel confident the patient has an aseptic osteolysis process occurring which is leading to severe right hip pain and dysfunction. We reviewed treatment options including continued conservative treatments possible injections and additional therapy but the patient feels at this point that his right hip is functionally limiting him on a daily basis without improvement with prior conservative measures and based upon the patient's continued symptoms and failure to respond to conservative treatment, I have recommended a revision right total hip replacement for this patient. A long discussion took place with the patient describing what a revision total joint replacement is and what the procedure would entail. A hip model, similar to the implants that will be used during the operation, was utilized to demonstrate the implants. Choices of implant manufactures were discussed and reviewed. The ability to secure the implant utilizing cement or cementless (press fit) fixation was discussed. Anterior and posterior exposures were discussed. For this patient an appropriate approach will be posterior. The surgical plan and indications were reviewed with a senior arthroplasty partner who agrees with the imaging interpretations and plan.   The hospitalization and post-operative care and rehabilitation were also discussed. The use of  perioperative antibiotics and DVT prophylaxis were discussed. The risk, benefits and alternatives to a surgical  intervention were discussed at length with the patient. The patient was also advised of risks related to the medical comorbidities and elevated body mass index (BMI). A lengthy discussion took place to review the most common complications including but not limited to: deep vein thrombosis, pulmonary embolus, heart attack, stroke, infection, wound breakdown, heterotopic ossification, dislocation, numbness, leg length in-equality, intraoperative fracture, need for extended trochanteric osteotomy and possible postoperative limitations, damage to nerves, tendon,muscles, arteries or other blood vessels, death and other possible complications from anesthesia. The patient was told that we will take steps to minimize these risks by using sterile technique, antibiotics and DVT prophylaxis when appropriate and follow the patient postoperatively in the office setting to monitor progress. The possibility of recurrent pain, no improvement in pain and actual worsening of pain were also discussed with the patient. The risk of dislocation following total hip replacement was discussed and potential precautions to prevent dislocation were reviewed. We also reviewed his young age and the increased likelihood for additional surgery needed during his life due to component wear.  The discharge plan of care focused on the patient going home following surgery. The patient was encouraged to make the necessary arrangements to have someone stay with them when they are discharged home.   The benefits of surgery were discussed with the patient including the potential for improving the patient's current clinical condition through operative intervention. Alternatives to surgical intervention including continued conservative management were also discussed in detail. All questions were answered to the satisfaction of the patient. The patient participated and agreed to the plan of care as well as the use of the recommended implants for their  total hip replacement surgery. An information packet was given to the patient to review prior to surgery.   The patient received medical clearance for surgery. All questions answered the patient agrees with above plan to proceed with preparation for a right revision total hip arthroplasty.  Portions of this record have been created using Scientist, clinical (histocompatibility and immunogenetics). Dictation errors have been sought, but may not have been identified and corrected.  Reinaldo Berber MD

## 2023-02-19 LAB — CBC
HCT: 28.8 % — ABNORMAL LOW (ref 39.0–52.0)
Hemoglobin: 10.1 g/dL — ABNORMAL LOW (ref 13.0–17.0)
MCH: 31.1 pg (ref 26.0–34.0)
MCHC: 35.1 g/dL (ref 30.0–36.0)
MCV: 88.6 fL (ref 80.0–100.0)
Platelets: 167 10*3/uL (ref 150–400)
RBC: 3.25 MIL/uL — ABNORMAL LOW (ref 4.22–5.81)
RDW: 12.3 % (ref 11.5–15.5)
WBC: 6.6 10*3/uL (ref 4.0–10.5)
nRBC: 0 % (ref 0.0–0.2)

## 2023-02-19 LAB — BASIC METABOLIC PANEL
Anion gap: 7 (ref 5–15)
BUN: 24 mg/dL — ABNORMAL HIGH (ref 6–20)
CO2: 27 mmol/L (ref 22–32)
Calcium: 7.9 mg/dL — ABNORMAL LOW (ref 8.9–10.3)
Chloride: 97 mmol/L — ABNORMAL LOW (ref 98–111)
Creatinine, Ser: 1.22 mg/dL (ref 0.61–1.24)
GFR, Estimated: >60 mL/min (ref >60)
Glucose, Bld: 120 mg/dL — ABNORMAL HIGH (ref 70–99)
Potassium: 4.4 mmol/L (ref 3.5–5.1)
Sodium: 131 mmol/L — ABNORMAL LOW (ref 135–145)

## 2023-02-19 LAB — AEROBIC/ANAEROBIC CULTURE W GRAM STAIN (SURGICAL/DEEP WOUND): Culture: NO GROWTH

## 2023-02-19 MED ORDER — KETOROLAC TROMETHAMINE 15 MG/ML IJ SOLN
INTRAMUSCULAR | Status: AC
  Filled 2023-02-19: qty 1

## 2023-02-19 MED ORDER — AMLODIPINE BESYLATE 5 MG PO TABS: 10.0000 mg | ORAL_TABLET | Freq: Every day | ORAL | Status: DC

## 2023-02-19 MED ORDER — HYDROCODONE-ACETAMINOPHEN 5-325 MG PO TABS
ORAL_TABLET | ORAL | Status: AC
  Filled 2023-02-19: qty 1

## 2023-02-19 MED ORDER — ACETAMINOPHEN 500 MG PO TABS
ORAL_TABLET | ORAL | Status: AC
  Filled 2023-02-19: qty 2

## 2023-02-19 MED ORDER — ENOXAPARIN SODIUM 40 MG/0.4ML IJ SOSY
PREFILLED_SYRINGE | INTRAMUSCULAR | Status: AC
  Filled 2023-02-19: qty 0.4

## 2023-02-19 MED ORDER — PANTOPRAZOLE SODIUM 40 MG PO TBEC
DELAYED_RELEASE_TABLET | ORAL | Status: AC
  Filled 2023-02-19: qty 1

## 2023-02-19 MED ORDER — MORPHINE SULFATE (PF) 4 MG/ML IV SOLN
INTRAVENOUS | Status: AC
  Filled 2023-02-19: qty 1

## 2023-02-19 MED ORDER — LISINOPRIL 20 MG PO TABS: 40.0000 mg | ORAL_TABLET | Freq: Every day | ORAL | Status: DC

## 2023-02-19 MED ORDER — OXYCODONE HCL 5 MG PO TABS
ORAL_TABLET | ORAL | Status: AC
  Filled 2023-02-19: qty 1

## 2023-02-19 MED ORDER — DOCUSATE SODIUM 100 MG PO CAPS
ORAL_CAPSULE | ORAL | Status: AC
  Filled 2023-02-19: qty 1

## 2023-02-19 NOTE — Progress Notes (Addendum)
   Subjective: 1 Day Post-Op Procedure(s) (LRB): Right total hip revision, both components (Right) Patient reports pain as mild.   Patient is well, and has had no acute complaints or problems Denies any CP, SOB, ABD pain. We will continue therapy today.  Plan is to go Home after hospital stay.  Objective: Vital signs in last 24 hours: Temp:  [97.5 F (36.4 C)-99.1 F (37.3 C)] 98.5 F (36.9 C) (04/19 0448) Pulse Rate:  [82-99] 82 (04/19 0448) Resp:  [12-22] 16 (04/19 0448) BP: (104-134)/(56-85) 105/56 (04/19 0448) SpO2:  [94 %-100 %] 96 % (04/19 0448)  Intake/Output from previous day: 04/18 0701 - 04/19 0700 In: 2720 [I.V.:2300; IV Piggyback:420] Out: 1125 [Urine:525; Blood:600] Intake/Output this shift: No intake/output data recorded.  Recent Labs    02/19/23 0557  HGB 10.1*   Recent Labs    02/19/23 0557  WBC 6.6  RBC 3.25*  HCT 28.8*  PLT 167   Recent Labs    02/19/23 0557  NA 131*  K 4.4  CL 97*  CO2 27  BUN 24*  CREATININE 1.22  GLUCOSE 120*  CALCIUM 7.9*   No results for input(s): "LABPT", "INR" in the last 72 hours.  EXAM General - Patient is Alert, Appropriate, and Oriented Extremity - Neurovascular intact Sensation intact distally Intact pulses distally Dorsiflexion/Plantar flexion intact Dressing - dressing C/D/I and no drainage, honeycomb intact Motor Function - intact, moving foot and toes well on exam.   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   Weight as of this encounter: 93.5 kg. Advance diet Up with therapy Pain well-controlled  Vital signs stable, BP soft.  Hold BP meds this morning. Patient has been up and ambulatory to the restroom with no symptoms of hypotension.  Patient will continue to monitor BP at home, states he has a automatic blood pressure cuff  Labs are stable  Care management to assist with discharge to home with home health PT    DVT Prophylaxis - Lovenox, TED hose, and SCDs Weight-Bearing as tolerated to right leg   T. Cranston Neighbor, PA-C [facility] 02/19/2023, 7:52 AM   Patient seen and examined, agree with above plan.  The patient is doing well status post right hip revision arthroplasty, no concerns at this time.  Pain is controlled.  Discussed DVT prophylaxis, pain medication use, and safe transition to home.  All questions answered the patient agrees with above plan will go home after clears PT.   Reinaldo Berber MD

## 2023-02-19 NOTE — Progress Notes (Signed)
Patient is not able to walk the distance required to go the bathroom, or he is unable to safely negotiate stairs required to access the bathroom.  A 3in1 BSC will alleviate this problem.       T. Chris Clovia Reine, PA-C [facility] 

## 2023-02-19 NOTE — Discharge Summary (Signed)
Physician Discharge Summary  Patient ID: Angel Bautista MRN: [id number] DOB/AGE: October 15, 1971 52 y.o.  Admit date: 02/18/2023 Discharge date: 02/19/2023  Admission Diagnoses:  History of revision of total replacement of right hip joint [Z96.641]   Discharge Diagnoses: Patient Active Problem List   Diagnosis Date Noted   History of revision of total replacement of right hip joint 02/18/2023   Pre-op examination 02/15/2023   Family history of colon polyps, unspecified 11/27/2022   Long-term current use of testosterone cypionate 02/25/2022   Erectile dysfunction 07/16/2021   Nocturia 07/16/2021   Preventative health care 07/16/2021   Genital herpes simplex 05/06/2018   Essential hypertension 05/02/2018   Primary localized osteoarthritis of right hip 11/24/2016   Primary osteoarthritis of left hip 05/14/2015    Past Medical History:  Diagnosis Date   Arthritis    History of chicken pox    Hypertension    Pain due to total hip replacement, subsequent encounter    Palpitations    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 male who was admitted 02/18/2023 with a diagnosis of History of revision of total replacement of right hip joint and went to the operating room on 02/18/2023 and underwent the above named procedures.    Surgeries: Procedure(s): Right total hip revision, both components on 02/18/2023 Patient tolerated the surgery well. Taken to PACU where she was stabilized and then transferred to the orthopedic floor.  Started on Lovenox  q 24 hrs. TEDs and SCDs applied bilaterally. Heels elevated on bed. No evidence of DVT. Negative Homan. Physical therapy started on day #1 for gait training and transfer. OT started day #1 for ADL and assisted devices. Patient's IV was d/c on day #1. Patient was able to  safely and independently complete all PT goals. PT recommending discharge to home.  On post op day #1 patient was stable and ready for discharge to home with HHPT.  Implants: Cup Trident Multi-hole 52/E w/x4 screws    Liner: X3 10 degree lipped 36/E  Stem: Restoration modular 16mm/+10 proximal body w/ 155x52mm stem  Head: 36mm +0 ceramic biolox    He was given perioperative antibiotics:  Anti-infectives (From admission, onward)    Start     Dose/Rate Route Frequency Ordered Stop   02/18/23 1400  ceFAZolin (ANCEF) IVPB 2g/100 mL premix        2 g 200 mL/hr over 30 Minutes Intravenous Every 6 hours 02/18/23 1308 02/18/23 2117   02/18/23 0600  ceFAZolin (ANCEF) IVPB 2g/100 mL premix        2 g 200 mL/hr over 30 Minutes Intravenous On call to O.R. 02/18/23 4098 02/18/23 0813     .  He was given sequential compression devices, early ambulation, and Lovenox, teds for DVT prophylaxis.  He benefited maximally from the hospital stay and there were no complications.    Recent vital signs:  Vitals:   02/18/23 2240 02/19/23 0448  BP: 117/65 (!) 105/56  Pulse: 84 82  Resp: 16 16  Temp: 98.7 F (37.1 C) 98.5 F (36.9 C)  SpO2: 94% 96%    Recent laboratory studies:  Lab Results  Component Value Date   HGB 10.1 (L) 02/19/2023   HGB 15.0 02/08/2023   HGB 15.6 10/15/2022   Lab Results  Component Value Date   WBC 6.6  02/19/2023   PLT 167 02/19/2023   Lab Results  Component Value Date   INR 1.00 11/19/2016   Lab Results  Component Value Date   NA 131 (L) 02/19/2023   K 4.4 02/19/2023   CL 97 (L) 02/19/2023   CO2 27 02/19/2023   BUN 24 (H) 02/19/2023   CREATININE 1.22 02/19/2023   GLUCOSE 120 (H) 02/19/2023    Discharge Medications:   Allergies as of 02/19/2023       Reactions   Tramadol Itching   Penicillins Swelling        Medication List     STOP taking these medications    HYDROcodone-acetaminophen 5-325 MG tablet Commonly known as: NORCO/VICODIN        TAKE these medications    acetaminophen 500 MG tablet Commonly known as: TYLENOL Take 1,000 mg by mouth every 6 (six) hours as needed for moderate pain.   amLODipine 10 MG tablet Commonly known as: NORVASC Take 1 tablet (10 mg total) by mouth daily.   celecoxib 200 MG capsule Commonly known as: CeleBREX Take 1 capsule (200 mg total) by mouth 2 (two) times daily for 10 days. What changed: when to take this   CENTRUM ADULT PO Take 1 tablet by mouth daily.   docusate sodium 100 MG capsule Commonly known as: COLACE Take 1 capsule (100 mg total) by mouth 2 (two) times daily.   enoxaparin 40 MG/0.4ML injection Commonly known as: LOVENOX Inject 0.4 mLs (40 mg total) into the skin daily for 14 days.   lisinopril 40 MG tablet Commonly known as: ZESTRIL Take 1 tablet (40 mg total) by mouth daily.   loratadine 10 MG tablet Commonly known as: CLARITIN Take 10 mg by mouth as needed for allergies.   ondansetron 4 MG/2ML Soln injection Commonly known as: ZOFRAN Inject 2 mLs (4 mg total) into the vein every 6 (six) hours as needed for nausea.   oxyCODONE 5 MG immediate release tablet Commonly known as: Oxy IR/ROXICODONE Take 0.5-1 tablets (2.5-5 mg total) by mouth every 4 (four) hours as needed for severe pain.   sildenafil 20 MG tablet Commonly known as: REVATIO TAKE ONE TO FOUR TABLETS BY MOUTH ONCE DAILY. TAKE 30 MINUTES TO FOUR HOURS PRIOR TO INTERCOURSE. DO NOT TAKE MORE THAN ONCE IN 24 HOURS   valACYclovir 500 MG tablet Commonly known as: VALTREX Take 2 tablets x 2 doses as needed for break out        Diagnostic Studies: DG HIP UNILAT WITH PELVIS 2-3 VIEWS RIGHT  Result Date: 02/18/2023 CLINICAL DATA:  [id number] Surgery, elective [id number] EXAM: DG HIP (WITH OR WITHOUT PELVIS) 2-3V RIGHT COMPARISON:  CT 01/25/2023 FINDINGS: Postsurgical changes of right hip arthroplasty revision with long stem femoral component. Similar proximal femur perihardware lucency. The distal long  stem appears well seated. There is no evidence of periprosthetic fracture. Unchanged left hip arthroplasty. Expected soft tissue changes. IMPRESSION: Postsurgical changes of right hip arthroplasty revision with long stem femoral component. No evidence of immediate hardware complication. Electronically Signed   By: Caprice Renshaw M.D.   On: 02/18/2023 13:00   DG Pelvis Portable  Result Date: 02/18/2023 CLINICAL DATA:  Right total hip revision. Intraoperative radiograph. EXAM: PORTABLE PELVIS 1-2 VIEWS COMPARISON:  Pelvis and right hip radiographs 12/24/2022 FINDINGS: Single frontal view of the pelvis centered on the right acetabulum. Interval removal of the prior acetabular cup and femoral stem. New acetabular cup is seated with 3 new screws overlying the right acetabulum. Partial visualization of  femoral stem. Subcutaneous air overlies the right hip surgical bed. Partial visualization of total left hip arthroplasty. IMPRESSION: Intraoperative radiograph during revision of right total hip arthroplasty. Electronically Signed   By: Neita Garnet M.D.   On: 02/18/2023 11:17   DG FEMUR, MIN 2 VIEWS RIGHT  Result Date: 02/18/2023 CLINICAL DATA:  Right total hip arthroplasty revision. EXAM: RIGHT FEMUR 2 VIEWS COMPARISON:  12/24/2022 FINDINGS: Intraoperative spot films demonstrate a long-stem component in the femur. No complicating features. IMPRESSION: Long-stem component in the femur. Electronically Signed   By: Rudie Meyer M.D.   On: 02/18/2023 11:12   NM 3P BONE W/SPECT CT  Result Date: 01/27/2023 CLINICAL DATA:  Right hip pain. History of bilateral total hip arthroplasty. Pain for at least 1 year. Recent radiographs demonstrated perihardware lucency in the proximal right femur. EXAM: NM BONE SCAN AND SPECT IMAGING TECHNIQUE: After intravenous injection of radiopharmaceutical, delayed planar images were obtained in multiple projections. Additionally, delayed triplanar SPECT images were obtained through the  area of interest. RADIOPHARMACEUTICALS:  20.39 mCi Tc-59m MDP COMPARISON:  CT same date.  Radiographs 12/24/2022 and 11/24/2016. FINDINGS: Perfusion: There is normal symmetric perfusion to both hips and proximal thighs. Blood pool: Normal symmetric blood pool activity. Delayed phase: Planar images demonstrate minimal asymmetric activity surrounding the femoral stem of the right total hip arthroplasty. There is no abnormal activity surrounding the left femoral stem or either acetabular cup. SPECT CT images demonstrate previous bilateral total hip arthroplasty. As described on the concurrent CT of the right hip, there is asymmetric lucency surrounding the right femoral stem with associated sclerotic margins. There is only mild uptake around the distal end of the right femoral stem. No evidence of periarticular soft tissue thickening or abnormal soft tissue uptake to suggest particle disease. There is cyst formation within the right acetabulum, without abnormal uptake. Degenerative changes are present within the lower lumbar spine. IMPRESSION: 1. The bone scan findings are not impressive with normal dynamic and blood pool phases. There is mild delayed phase uptake around the distal aspect of the right femoral stem. 2. Asymmetric perihardware lucency surrounding the right femoral again noted without generalized bone scan uptake. Electronically Signed   By: Carey Bullocks M.D.   On: 01/27/2023 12:09   CT HIP RIGHT WO CONTRAST  Result Date: 01/27/2023 CLINICAL DATA:  Right hip pain. History of bilateral total hip arthroplasty. Pain for at least 1 year. Recent radiographs demonstrated perihardware lucency in the proximal right femur. EXAM: CT OF THE RIGHT HIP WITHOUT CONTRAST TECHNIQUE: Multidetector CT imaging of the right hip was performed according to the standard protocol. Multiplanar CT image reconstructions were also generated. RADIATION DOSE REDUCTION: This exam was performed according to the departmental  dose-optimization program which includes automated exposure control, adjustment of the mA and/or kV according to patient size and/or use of iterative reconstruction technique. COMPARISON:  Radiographs 11/24/2016 and 12/24/2022. Concurrent three-phase bone scan. FINDINGS: Bones/Joint/Cartilage The scout image demonstrates previous bilateral total arthroplasty (performed on the right 11/24/2016). Asymmetric lucency at the bone-cement interface in the proximal right femur has progressed from the original postoperative radiographs, measuring up to 11 mm in thickness on coronal image 45/6. This has sclerotic margins. There is no significant lucency surrounding the acetabular component. Underlying osteophytes and subchondral cyst formation are present peripherally in the right acetabulum. No evidence of acute fracture or dislocation. No significant joint effusion allowing for the artifact associated with the hip replacement. Ligaments Suboptimally assessed by CT. Muscles and Tendons No intramuscular  fluid collection or atrophy. Soft tissues No unexpected foreign body, fluid collection or soft tissue emphysema. Mild iliofemoral atherosclerosis. IMPRESSION: 1. Asymmetric lucency at the bone-cement interface in the proximal right femur has progressed from the prior radiographs, suspicious for loosening. 2. No evidence of acute fracture or dislocation. 3. No significant joint effusion or intramuscular fluid collection. Electronically Signed   By: Carey Bullocks M.D.   On: 01/27/2023 11:58    Disposition:      Follow-up Information     Evon Slack, PA-C Follow up in 2 week(s).   Specialties: Orthopedic Surgery, Emergency Medicine Contact information: [address] [place] [address] [phone]                  Signed: Patience Musca 02/19/2023, 7:56 AM

## 2023-02-19 NOTE — Plan of Care (Signed)
  Problem: Activity: Goal: Ability to avoid complications of mobility impairment will improve Outcome: Progressing   Problem: Pain Management: Goal: Pain level will decrease with appropriate interventions Outcome: Progressing   Problem: Skin Integrity: Goal: Will show signs of wound healing Outcome: Progressing   

## 2023-02-19 NOTE — Evaluation (Signed)
Occupational Therapy Evaluation Patient Details Name: Angel Bautista MRN: [id number] DOB: 06/23/1971 Today's Date: 02/19/2023   History of Present Illness Angel Bautista is a 51yoM who comes to [facility] on 02/18/23 for elective Rt hip joint revision, First THA in 2018, hardware no longer functioning as advertised. PTA pt works, drives, lives alone, was at the gym just last week.   Clinical Impression   Pt seen for OT evaluation this date, POD#1 from above surgery. Pt was independent in all ADL prior to surgery and is eager to return to PLOF with less pain and improved safety and independence. Pt currently requires PRN minimal assist for LB dressing and bathing while in seated position due to pain and limited AROM of R hip. Pt able to recall 2/3 posterior total hip precautions at start of session and unable to fully verbalize how to implement during ADL and mobility. Pt instructed in posterior total hip precautions and how to implement, self care skills, falls prevention strategies, home/routines modifications, DME/AE for LB bathing and dressing tasks, compression stocking mgt strategies, and ADL mobility with RW. Pt at high risk of non-adherence to posterior hip precautions with limited assist available at home. Pt verbalizes confidence in recovery. At end of session, pt able to recall 3/3 posterior total hip precautions.    Recommendations for follow up therapy are one component of a multi-disciplinary discharge planning process, led by the attending physician.  Recommendations may be updated based on patient status, additional functional criteria and insurance authorization.   Assistance Recommended at Discharge PRN  Patient can return home with the following A little help with bathing/dressing/bathroom;Assistance with cooking/housework;Assist for transportation;Help with stairs or ramp for entrance    Functional Status Assessment  Patient has had a recent decline in their functional status and  demonstrates the ability to make significant improvements in function in a reasonable and predictable amount of time.  Equipment Recommendations  Other (comment) Lexicographer)    Recommendations for Other Services       Precautions / Restrictions Precautions Precautions: Posterior Hip Precaution Booklet Issued: Yes (comment) Restrictions Weight Bearing Restrictions: Yes RLE Weight Bearing: Weight bearing as tolerated      Mobility Bed Mobility               General bed mobility comments: NT, in recliner    Transfers Overall transfer level: Needs assistance Equipment used: Rolling walker (2 wheels) Transfers: Sit to/from Stand Sit to Stand: Supervision                  Balance                                           ADL either performed or assessed with clinical judgement   ADL                                         General ADL Comments: Pt requires PRN MIN A for LB ADL tasks, pt reports confidence in managing at home, extensive instruction provided in AE/DME and strategies to maintain posterior hip precautions with LB dressing, bathing, and toileting     Vision         Perception     Praxis      Pertinent Vitals/Pain Pain Assessment Pain Assessment: 0-10 Pain  Score: 5  Pain Location: surgical hip Pain Descriptors / Indicators: Aching Pain Intervention(s): Monitored during session, Premedicated before session     Hand Dominance     Extremity/Trunk Assessment Upper Extremity Assessment Upper Extremity Assessment: Overall WFL for tasks assessed   Lower Extremity Assessment Lower Extremity Assessment: RLE deficits/detail RLE Deficits / Details: expected post-op deficits       Communication     Cognition Arousal/Alertness: Awake/alert Behavior During Therapy: WFL for tasks assessed/performed Overall Cognitive Status: Within Functional Limits for tasks assessed                                        General Comments       Exercises Other Exercises Other Exercises: Pt educated in precautions and how to maintain during ADL and mobility, positioning instruction for sleep/rest, falls prevention   Shoulder Instructions      Home Living Family/patient expects to be discharged to:: Private residence Living Arrangements: Alone   Type of Home: House Home Access: Stairs to enter Secretary/administrator of Steps: 4 Entrance Stairs-Rails: Right;Left;Can reach both Home Layout: One level     Bathroom Shower/Tub: Chief Strategy Officer: Standard     Home Equipment: Agricultural consultant (2 wheels);Cane - single point;Crutches          Prior Functioning/Environment Prior Level of Function : Independent/Modified Independent;Driving                        OT Problem List: Decreased strength;Decreased range of motion;Pain      OT Treatment/Interventions:      OT Goals(Current goals can be found in the care plan section) Acute Rehab OT Goals Patient Stated Goal: get better OT Goal Formulation: All assessment and education complete, DC therapy  OT Frequency:      Co-evaluation              AM-PAC OT "6 Clicks" Daily Activity     Outcome Measure Help from another person eating meals?: None Help from another person taking care of personal grooming?: None Help from another person toileting, which includes using toliet, bedpan, or urinal?: None Help from another person bathing (including washing, rinsing, drying)?: A Little Help from another person to put on and taking off regular upper body clothing?: None Help from another person to put on and taking off regular lower body clothing?: A Little 6 Click Score: 22   End of Session Nurse Communication: Mobility status  Activity Tolerance: Patient tolerated treatment well Patient left: in chair  OT Visit Diagnosis: Other abnormalities of gait and mobility (R26.89);Pain Pain - Right/Left:  Right Pain - part of body: Hip                Time: 4098-1191 OT Time Calculation (min): 16 min Charges:  OT General Charges $OT Visit: 1 Visit OT Evaluation $OT Eval Low Complexity: 1 Low OT Treatments $Self Care/Home Management : 8-22 mins  Arman Filter., MPH, MS, OTR/L ascom [phone] 02/19/23, 10:21 AM

## 2023-02-19 NOTE — Progress Notes (Signed)
Physical Therapy Treatment Patient Details Name: Angel Bautista MRN: [id number] DOB: 03/15/1971 Today's Date: 02/19/2023   History of Present Illness Perris Conwell is a 51yoM who comes to [facility] on 02/18/23 for elective Rt hip joint revision, First THA in 2018, hardware no longer functioning as advertised. PTA pt works, drives, lives alone, was at the gym just last week.    PT Comments    Pt in bed sitting up, waiting on breakfast, he asks about his PRN analgesia and author reaches out to [employer]. Pt educated on HEP handout, no difficulty, pain at goal. Pt is more sore today, but generally moving better, faster, more confidence with bed mobility and transfers. AMB is also a bit faster but not as dramatic a change- fortunately pt is more focused on quality today than speed. Pt demonstrates ability to safely navigate 4 entry stairs. Pt set up in recliner at [facility] for meal. Pt aware of precautions, but automated postural tendencies continue to put him in situations where he is at 90 degrees flexion or questionably beyond that- this is an ongoing, evolving discussion in session. Pt has met all PT goals at this time, he denies any need to another session prior to going home.      Recommendations for follow up therapy are one component of a multi-disciplinary discharge planning process, led by the attending physician.  Recommendations may be updated based on patient status, additional functional criteria and insurance authorization.  Follow Up Recommendations       Assistance Recommended at Discharge PRN  Patient can return home with the following Assist for transportation;Assistance with cooking/housework;Help with stairs or ramp for entrance   Equipment Recommendations  None recommended by PT    Recommendations for Other Services       Precautions / Restrictions Precautions Precautions: Posterior Hip Precaution Booklet Issued: Yes (comment) Restrictions Weight Bearing Restrictions: Yes RLE Weight  Bearing: Weight bearing as tolerated     Mobility  Bed Mobility Overal bed mobility: Modified Independent                  Transfers Overall transfer level: Modified independent Equipment used: Rolling walker (2 wheels) Transfers: Sit to/from Stand                  Ambulation/Gait Ambulation/Gait assistance: Supervision Gait Distance (Feet): 260 Feet Assistive device: Rolling walker (2 wheels) Gait Pattern/deviations: Step-through pattern, WFL(Within Functional Limits)       General Gait Details: more use of RW for symmetry of gait, but asking about SPC use- author scolds   Stairs Stairs: Yes     Number of Stairs: 4     Wheelchair Mobility    Modified Rankin (Stroke Patients Only)       Balance                                            Cognition Arousal/Alertness: Awake/alert Behavior During Therapy: WFL for tasks assessed/performed Overall Cognitive Status: Within Functional Limits for tasks assessed                                          Exercises Total Joint Exercises Ankle Circles/Pumps: AROM, Right, 10 reps, Supine Quad Sets: AROM, Right, 10 reps, Supine Gluteal Sets: AROM, Both, 10 reps,  Supine Towel Squeeze: AROM, Both, 10 reps, Supine Short Arc Quad: AROM, Right, 10 reps, Supine Heel Slides: AROM, Right, AAROM, 10 reps, Supine Hip ABduction/ADduction: AROM, AAROM, Right, 10 reps, Supine    General Comments        Pertinent Vitals/Pain      Home Living Family/patient expects to be discharged to:: Private residence Living Arrangements: Alone   Type of Home: House Home Access: Stairs to enter Entrance Stairs-Rails: Right;Left;Can reach both Entrance Stairs-Number of Steps: 4   Home Layout: One level Home Equipment: Agricultural consultant (2 wheels);Cane - single point;Crutches      Prior Function            PT Goals (current goals can now be found in the care plan section) Acute  Rehab PT Goals Patient Stated Goal: regain strength return to gym acitivty PT Goal Formulation: With patient Time For Goal Achievement: 03/04/23 Potential to Achieve Goals: Good Progress towards PT goals: Progressing toward goals    Frequency    BID      PT Plan Current plan remains appropriate    Co-evaluation              AM-PAC PT "6 Clicks" Mobility   Outcome Measure  Help needed turning from your back to your side while in a flat bed without using bedrails?: A Little Help needed moving from lying on your back to sitting on the side of a flat bed without using bedrails?: A Little Help needed moving to and from a bed to a chair (including a wheelchair)?: A Little Help needed standing up from a chair using your arms (e.g., wheelchair or bedside chair)?: A Little Help needed to walk in hospital room?: A Little Help needed climbing 3-5 steps with a railing? : A Little 6 Click Score: 18    End of Session Equipment Utilized During Treatment: Gait belt Activity Tolerance: Patient tolerated treatment well;No increased pain Patient left: in chair;with call bell/phone within reach;with nursing/sitter in room Nurse Communication: Mobility status PT Visit Diagnosis: Other abnormalities of gait and mobility (R26.89);Muscle weakness (generalized) (M62.81)     Time: 1610-9604 PT Time Calculation (min) (ACUTE ONLY): 30 min  Charges:  $Therapeutic Exercise: 23-37 mins                    12:26 PM, 02/19/23 Rosamaria Lints, PT, DPT Physical Therapist - [facility]  [phone] (ASCOM)    Verlee Pope C 02/19/2023, 12:21 PM

## 2023-02-19 NOTE — Progress Notes (Signed)
DISCHARGE NOTE:  Pt given discharge instructions and verbalized understanding. Pt wheeled to car by staff, friend providing transportation.

## 2023-02-20 LAB — AEROBIC/ANAEROBIC CULTURE W GRAM STAIN (SURGICAL/DEEP WOUND)

## 2023-02-22 LAB — AEROBIC/ANAEROBIC CULTURE W GRAM STAIN (SURGICAL/DEEP WOUND)
Gram Stain: NONE SEEN
Gram Stain: NONE SEEN

## 2023-02-23 ENCOUNTER — Telehealth: Payer: Self-pay

## 2023-02-23 LAB — AEROBIC/ANAEROBIC CULTURE W GRAM STAIN (SURGICAL/DEEP WOUND)

## 2023-02-23 NOTE — Transitions of Care (Post Inpatient/ED Visit) (Signed)
**Note Angel-Identified via Obfuscation**    02/23/2023  Name: DEMONE Bautista MRN: [id number] DOB: Aug 30, 1971  Today's TOC FU Call Status: Today's TOC FU Call Status:: Successful TOC FU Call Competed TOC FU Call Complete Date: 02/23/23  Transition Care Management Follow-up Telephone Call Date of Discharge: 02/19/23 Discharge Facility: [facility]) Type of Discharge: Inpatient Admission Primary Inpatient Discharge Diagnosis:: Hx of revision of total replacement right hip How have you been since you were released from the hospital?: Same Any questions or concerns?: No  Items Reviewed: Did you receive and understand the discharge instructions provided?: Yes Medications obtained and verified?: Yes (Medications Reviewed) Any new allergies since your discharge?: No Dietary orders reviewed?: NA Do you have support at home?: Yes People in Home: child(ren), adult  Home Care and Equipment/Supplies: Were Home Health Services Ordered?: No Any new equipment or medical supplies ordered?: No  Functional Questionnaire: Do you need assistance with bathing/showering or dressing?: Yes Do you need assistance with meal preparation?: Yes Do you need assistance with eating?: No Do you need assistance with getting out of bed/getting out of a chair/moving?: Yes Do you have difficulty managing or taking your medications?: No  Follow up appointments reviewed: PCP Follow-up appointment confirmed?: NA Specialist Hospital Follow-up appointment confirmed?: Yes Date of Specialist follow-up appointment?: 03/05/23 Follow-Up Specialty Provider:: Dr. Floyce Stakes Do you need transportation to your follow-up appointment?: No Do you understand care options if your condition(s) worsen?: Yes-patient verbalized understanding    SIGNATURE  Kandis Fantasia, LPN [facility] Advisor [place] [facility] You Are. We Are. [employer] # [phone]

## 2023-03-26 ENCOUNTER — Other Ambulatory Visit: Payer: Self-pay | Admitting: Family Medicine

## 2023-09-11 ENCOUNTER — Other Ambulatory Visit: Payer: Self-pay | Admitting: Nurse Practitioner

## 2023-09-11 DIAGNOSIS — N529 Male erectile dysfunction, unspecified: Secondary | ICD-10-CM

## 2023-10-12 ENCOUNTER — Other Ambulatory Visit: Payer: Self-pay | Admitting: Nurse Practitioner

## 2023-10-12 DIAGNOSIS — I10 Essential (primary) hypertension: Secondary | ICD-10-CM

## 2023-10-18 ENCOUNTER — Ambulatory Visit (INDEPENDENT_AMBULATORY_CARE_PROVIDER_SITE_OTHER): Payer: BLUE CROSS/BLUE SHIELD | Admitting: Nurse Practitioner

## 2023-10-18 ENCOUNTER — Encounter: Payer: Self-pay | Admitting: Nurse Practitioner

## 2023-10-18 VITALS — BP 136/78 | HR 67 | Temp 98.1°F | Ht 70.25 in | Wt 211.2 lb

## 2023-10-18 DIAGNOSIS — I10 Essential (primary) hypertension: Secondary | ICD-10-CM

## 2023-10-18 DIAGNOSIS — Z13220 Encounter for screening for lipoid disorders: Secondary | ICD-10-CM

## 2023-10-18 DIAGNOSIS — Z0000 Encounter for general adult medical examination without abnormal findings: Secondary | ICD-10-CM

## 2023-10-18 DIAGNOSIS — Z125 Encounter for screening for malignant neoplasm of prostate: Secondary | ICD-10-CM

## 2023-10-18 DIAGNOSIS — N529 Male erectile dysfunction, unspecified: Secondary | ICD-10-CM | POA: Diagnosis not present

## 2023-10-18 DIAGNOSIS — Z87891 Personal history of nicotine dependence: Secondary | ICD-10-CM | POA: Diagnosis not present

## 2023-10-18 DIAGNOSIS — E669 Obesity, unspecified: Secondary | ICD-10-CM

## 2023-10-18 DIAGNOSIS — A6000 Herpesviral infection of urogenital system, unspecified: Secondary | ICD-10-CM

## 2023-10-18 MED ORDER — SILDENAFIL CITRATE 100 MG PO TABS: 50.0000 mg | ORAL_TABLET | Freq: Every day | ORAL | 11 refills | Status: DC | PRN

## 2023-10-18 NOTE — Progress Notes (Signed)
Established Patient Office Visit  Subjective   Patient ID: Angel Bautista, male    DOB: 06/19/1971  Age: 52 y.o. MRN: [id number]  Chief Complaint  Patient presents with   Annual Exam    HPI  for complete physical and follow up of chronic conditions.  HTN: amloidpine 10 and lisnipril. Does check at home intermittenlty   ED: state that he can get an erection but has difficulty attaining it.  Currently maintained on 60mg  of sildenafil that does well     Immunizations: -Tetanus: Completed in 2021 -Influenza: refused  -Shingles: Completed Shingrix series -Pneumonia: too young   Diet: Fair diet. He will eat 4 meals at work. Then he will have 3 at home. Coffee in the am and water throught the day and 2 beers at home  Exercise:  1 hour 5 days a week and will do weights and do super set   Eye exam: prn  Dental exam: Completes semi-annually    Colonoscopy: Completed in 11/27/2022, repeat in 5 years. Diverticulosis  Lung Cancer Screening: N/A  PSA: Due  Sleep:8pm and gets up at 3am and feels rested most times.       Review of Systems  Constitutional:  Negative for chills and fever.  Respiratory:  Negative for shortness of breath.   Cardiovascular:  Positive for leg swelling. Negative for chest pain.  Gastrointestinal:  Negative for abdominal pain, blood in stool, constipation, diarrhea, nausea and vomiting.       Bm daily   Genitourinary:  Negative for dysuria and hematuria.  Neurological:  Negative for tingling and headaches.  Psychiatric/Behavioral:  Negative for hallucinations and suicidal ideas.       Objective:     BP 136/78   Pulse 67   Temp 98.1 F (36.7 C) (Oral)   Ht 5' 10.25" (1.784 m)   Wt 211 lb 3.2 oz (95.8 kg)   SpO2 97%   BMI 30.09 kg/m  BP Readings from Last 3 Encounters:  10/18/23 136/78  02/19/23 (!) 105/56  02/15/23 120/64   Wt Readings from Last 3 Encounters:  10/18/23 211 lb 3.2 oz (95.8 kg)  02/18/23 206 lb 2 oz (93.5 kg)   02/15/23 206 lb 2 oz (93.5 kg)   SpO2 Readings from Last 3 Encounters:  10/18/23 97%  02/19/23 96%  02/15/23 98%      Physical Exam Vitals and nursing note reviewed.  Constitutional:      Appearance: Normal appearance.  HENT:     Right Ear: Tympanic membrane, ear canal and external ear normal.     Left Ear: Tympanic membrane, ear canal and external ear normal.     Mouth/Throat:     Mouth: Mucous membranes are moist.     Pharynx: Oropharynx is clear.  Eyes:     Extraocular Movements: Extraocular movements intact.     Pupils: Pupils are equal, round, and reactive to light.  Cardiovascular:     Rate and Rhythm: Normal rate and regular rhythm.     Pulses: Normal pulses.     Heart sounds: Normal heart sounds.  Pulmonary:     Effort: Pulmonary effort is normal.     Breath sounds: Normal breath sounds.  Abdominal:     General: Bowel sounds are normal. There is no distension.     Palpations: There is no mass.     Tenderness: There is no abdominal tenderness.     Hernia: No hernia is present.  Musculoskeletal:     Right lower  leg: No edema.     Left lower leg: No edema.  Lymphadenopathy:     Cervical: No cervical adenopathy.  Skin:    General: Skin is warm.  Neurological:     General: No focal deficit present.     Mental Status: He is alert.     Deep Tendon Reflexes:     Reflex Scores:      Bicep reflexes are 2+ on the right side and 2+ on the left side.      Patellar reflexes are 2+ on the right side and 2+ on the left side.    Comments: Bilateral upper and lower extremity strength 5/5  Psychiatric:        Mood and Affect: Mood normal.        Behavior: Behavior normal.        Thought Content: Thought content normal.        Judgment: Judgment normal.      No results found for any visits on 10/18/23.    The ASCVD Risk score (Arnett DK, et al., 2019) failed to calculate for the following reasons:   The valid total cholesterol range is 130 to 320 mg/dL     Assessment & Plan:   Problem List Items Addressed This Visit       Cardiovascular and Mediastinum   Essential hypertension   Patient currently maintained on amlodipine 10 mg and lisinopril 40 mg daily.  Tolerates medication well.  Blood pressure controlled continue      Relevant Medications   sildenafil (VIAGRA) 100 MG tablet   Other Relevant Orders   CBC   Comprehensive metabolic panel   TSH     Genitourinary   Genital herpes simplex   Take valacyclovir as needed for outbreaks        Other   Erectile dysfunction   Currently maintained on sildenafil 60 mg daily.  Will write sildenafil 100 mg tabs take 50 to 100 mg daily as needed prior to sexual intercourse.      Preventative health care - Primary   Discussed age-appropriate immunizations and screening exams.  Patient's personal, surgical, social, family histories.  Patient up-to-date on all age-appropriate vaccinations he would like.  He is refused the flu vaccine.  Patient up-to-date on CRC screening.  PSA placed today for prostate cancer screening.  Patient was given information at discharge about preventative healthcare maintenance with anticipatory guidance.      Relevant Orders   CBC   Comprehensive metabolic panel   TSH   Obesity (BMI 30-39.9)   Patient leads a healthy lifestyle do think BMI shows obesity but patient has quite a bit of muscle mass.  We will check cholesterol and A1c today      Relevant Orders   Hemoglobin A1c   Other Visit Diagnoses       Former tobacco use       Relevant Orders   Urine Microscopic     Screening for lipid disorders       Relevant Orders   Lipid panel     Screening for prostate cancer       Relevant Orders   PSA       Return in about 1 year (around 10/17/2024) for CPE and Labs.    Audria Nine, NP

## 2023-10-18 NOTE — Assessment & Plan Note (Signed)
Discussed age-appropriate immunizations and screening exams.  Patient's personal, surgical, social, family histories.  Patient up-to-date on all age-appropriate vaccinations he would like.  He is refused the flu vaccine.  Patient up-to-date on CRC screening.  PSA placed today for prostate cancer screening.  Patient was given information at discharge about preventative healthcare maintenance with anticipatory guidance.

## 2023-10-18 NOTE — Assessment & Plan Note (Signed)
Currently maintained on sildenafil 60 mg daily.  Will write sildenafil 100 mg tabs take 50 to 100 mg daily as needed prior to sexual intercourse.

## 2023-10-18 NOTE — Assessment & Plan Note (Signed)
Patient currently maintained on amlodipine 10 mg and lisinopril 40 mg daily.  Tolerates medication well.  Blood pressure controlled continue

## 2023-10-18 NOTE — Assessment & Plan Note (Signed)
Take valacyclovir as needed for outbreaks

## 2023-10-18 NOTE — Assessment & Plan Note (Signed)
Patient leads a healthy lifestyle do think BMI shows obesity but patient has quite a bit of muscle mass.  We will check cholesterol and A1c today

## 2023-10-18 NOTE — Patient Instructions (Signed)
Nice to see you today I will be in touch with the labs once I have them Follow up with me in 1 year, sooner if you need me   

## 2023-10-19 LAB — COMPREHENSIVE METABOLIC PANEL
ALT: 69 U/L — ABNORMAL HIGH (ref 0–53)
AST: 47 U/L — ABNORMAL HIGH (ref 0–37)
Albumin: 4.5 g/dL (ref 3.5–5.2)
Alkaline Phosphatase: 46 U/L (ref 39–117)
BUN: 25 mg/dL — ABNORMAL HIGH (ref 6–23)
CO2: 30 meq/L (ref 19–32)
Calcium: 9.1 mg/dL (ref 8.4–10.5)
Chloride: 95 meq/L — ABNORMAL LOW (ref 96–112)
Creatinine, Ser: 1.07 mg/dL (ref 0.40–1.50)
GFR: 78.82 mL/min (ref >60.00)
Glucose, Bld: 94 mg/dL (ref 70–99)
Potassium: 4.6 meq/L (ref 3.5–5.1)
Sodium: 132 meq/L — ABNORMAL LOW (ref 135–145)
Total Bilirubin: 0.5 mg/dL (ref 0.2–1.2)
Total Protein: 6.5 g/dL (ref 6.0–8.3)

## 2023-10-19 LAB — LIPID PANEL
Cholesterol: 149 mg/dL (ref 0–200)
HDL: 25.9 mg/dL — ABNORMAL LOW (ref >39.00)
LDL Cholesterol: 107 mg/dL — ABNORMAL HIGH (ref 0–99)
NonHDL: 122.74
Total CHOL/HDL Ratio: 6
Triglycerides: 80 mg/dL (ref 0.0–149.0)
VLDL: 16 mg/dL (ref 0.0–40.0)

## 2023-10-19 LAB — CBC
HCT: 39.4 % (ref 39.0–52.0)
Hemoglobin: 13.5 g/dL (ref 13.0–17.0)
MCHC: 34.2 g/dL (ref 30.0–36.0)
MCV: 88.3 fL (ref 78.0–100.0)
Platelets: 264 10*3/uL (ref 150.0–400.0)
RBC: 4.47 Mil/uL (ref 4.22–5.81)
RDW: 14.6 % (ref 11.5–15.5)
WBC: 6.8 10*3/uL (ref 4.0–10.5)

## 2023-10-19 LAB — TSH: TSH: 0.78 u[IU]/mL (ref 0.35–5.50)

## 2023-10-19 LAB — URINALYSIS, MICROSCOPIC ONLY
RBC / HPF: NONE SEEN (ref 0–?)
WBC, UA: NONE SEEN (ref 0–?)

## 2023-10-19 LAB — PSA: PSA: 5.06 ng/mL — ABNORMAL HIGH (ref 0.10–4.00)

## 2023-10-19 LAB — HEMOGLOBIN A1C: Hgb A1c MFr Bld: 5.4 % (ref 4.6–6.5)

## 2023-10-28 ENCOUNTER — Emergency Department
Admission: EM | Admit: 2023-10-28 | Discharge: 2023-10-28 | Disposition: A | Discharge status: Home / Self Care | Payer: BLUE CROSS/BLUE SHIELD | Attending: Emergency Medicine | Admitting: Emergency Medicine

## 2023-10-28 ENCOUNTER — Encounter: Payer: Self-pay | Admitting: Emergency Medicine

## 2023-10-28 ENCOUNTER — Other Ambulatory Visit: Payer: Self-pay

## 2023-10-28 DIAGNOSIS — M76892 Other specified enthesopathies of left lower limb, excluding foot: Secondary | ICD-10-CM | POA: Diagnosis not present

## 2023-10-28 DIAGNOSIS — M25552 Pain in left hip: Secondary | ICD-10-CM | POA: Diagnosis present

## 2023-10-28 MED ORDER — OXYCODONE HCL 5 MG PO TABS: 5.0000 mg | ORAL_TABLET | Freq: Three times a day (TID) | ORAL | 0 refills | Status: AC | PRN

## 2023-10-28 MED ORDER — METHOCARBAMOL 500 MG PO TABS
500.0000 mg | ORAL_TABLET | Freq: Once | ORAL | Status: AC
  Administered 2023-10-28: 500 mg via ORAL
  Filled 2023-10-28: qty 1

## 2023-10-28 MED ORDER — LIDOCAINE 5 % EX PTCH
1.0000 | MEDICATED_PATCH | Freq: Once | CUTANEOUS | Status: DC
  Administered 2023-10-28: 1 via TRANSDERMAL
  Filled 2023-10-28: qty 1

## 2023-10-28 MED ORDER — KETOROLAC TROMETHAMINE 60 MG/2ML IM SOLN
30.0000 mg | Freq: Once | INTRAMUSCULAR | Status: AC
  Administered 2023-10-28: 30 mg via INTRAMUSCULAR
  Filled 2023-10-28: qty 2

## 2023-10-28 MED ORDER — LIDOCAINE 5 % EX PTCH: 1.0000 | MEDICATED_PATCH | CUTANEOUS | 0 refills | Status: AC

## 2023-10-28 MED ORDER — METHOCARBAMOL 500 MG PO TABS: 500.0000 mg | ORAL_TABLET | Freq: Three times a day (TID) | ORAL | 0 refills | Status: AC | PRN

## 2023-10-28 NOTE — ED Triage Notes (Signed)
Pt here with left hip pain. Pt states he has had both hips replaced before. Pt states he had recent scans that were normal. Pt was given prescriptions for the pain which have not helped. Pt states he had a fall yesterday and landed on that left hip.

## 2023-10-28 NOTE — ED Provider Notes (Signed)
[facility] Provider Note    Event Date/Time   First MD Initiated Contact with Patient 10/28/23 0809     (approximate)   History   Hip Pain   HPI Angel Bautista is a 52 y.o. male with recently diagnosed hip flexor tendinitis presenting today for left hip pain.  Patient urgently saw orthopedics 1.5 months ago for similar pain symptoms and was diagnosed with flexor tendinitis.  Has been trialing outpatient Celebrex without significant relief of symptoms.  States he did fall 1 time due to severe pain when he stood up.  Has been able to walk on it since and not concerned about hip fracture.  He presents today asking for cortisone injection into his hip.  Otherwise denies numbness, tingling, weakness, injury elsewhere.  Reviewed last orthopedic note.     Physical Exam   Triage Vital Signs: ED Triage Vitals  Encounter Vitals Group     BP 10/28/23 0808 (!) 176/79     Systolic BP Percentile --      Diastolic BP Percentile --      Pulse Rate 10/28/23 0807 88     Resp 10/28/23 0807 18     Temp 10/28/23 0808 97.8 F (36.6 C)     Temp Source 10/28/23 0808 Oral     SpO2 10/28/23 0807 98 %     Weight 10/28/23 0807 211 lb 3.2 oz (95.8 kg)     Height 10/28/23 0807 5\' 10"  (1.778 m)     Head Circumference --      Peak Flow --      Pain Score 10/28/23 0807 8     Pain Loc --      Pain Education --      Exclude from Growth Chart --     Most recent vital signs: Vitals:   10/28/23 0807 10/28/23 0808  BP:  (!) 176/79  Pulse: 88   Resp: 18   Temp:  97.8 F (36.6 C)  SpO2: 98%    I have reviewed the vital signs. General:  Awake, alert, no acute distress. Head:  Normocephalic, Atraumatic. EENT:  PERRL, EOMI, Oral mucosa pink and moist, Neck is supple. Cardiovascular: Regular rate, 2+ distal pulses. Respiratory:  Normal respiratory effort, symmetrical expansion, no distress.   Extremities: Slight pain with range of motion of the left hip.  Has pain while  trying to resist extension of the left hip.  Slight tenderness palpation at the left hip.  No surrounding edema or erythema.  5 out of 5 in strength otherwise intact throughout bilateral lower extremities.  No numbness noted.  Able to walk without significant issue. Neuro:  Alert and oriented.  Interacting appropriately.   Skin:  Warm, dry, no rash.   Psych: Appropriate affect.    ED Results / Procedures / Treatments   Labs (all labs ordered are listed, but only abnormal results are displayed) Labs Reviewed - No data to display   EKG    RADIOLOGY    PROCEDURES:  Critical Care performed: No  Procedures   MEDICATIONS ORDERED IN ED: Medications  ketorolac (TORADOL) injection 30 mg (has no administration in time range)  lidocaine (LIDODERM) 5 % 1 patch (has no administration in time range)  methocarbamol (ROBAXIN) tablet 500 mg (has no administration in time range)     IMPRESSION / MDM / ASSESSMENT AND PLAN / ED COURSE  I reviewed the triage vital signs and the nursing notes.  Differential diagnosis includes, but is not limited to, flexor tendinitis  Patient's presentation is most consistent with acute, uncomplicated illness.  Patient is a 52 year old male with known flexor tendinitis presenting today for ongoing left hip pain.  Did have 1 recent fall but denies any pain over the bony prominences of his left hip.  No concern for hip fracture as he is ambulating on it.  Patient also does not want an x-ray today.  Symptoms seem most consistent with ongoing flexor tendinitis of the left hip.  No other red flag symptoms such as erythema, edema at the site or systemic symptoms like fevers or chills.  Will treat patient with multimodal pain approach.  I told him that we do not do cortisone injections here in the emergency department.  He will need to follow-up with orthopedics which she is agreeable with.  Medication sent to pharmacy and patient given  strict return precautions.      FINAL CLINICAL IMPRESSION(S) / ED DIAGNOSES   Final diagnoses:  Hip flexor tendonitis, left     Rx / DC Orders   ED Discharge Orders          Ordered    methocarbamol (ROBAXIN) 500 MG tablet  Every 8 hours PRN        10/28/23 0825    lidocaine (LIDODERM) 5 %  Every 24 hours        10/28/23 0825    oxyCODONE (ROXICODONE) 5 MG immediate release tablet  Every 8 hours PRN        10/28/23 0825             Note:  This document was prepared using Dragon voice recognition software and may include unintentional dictation errors.   Janith Lima, MD 10/28/23 [phone]

## 2023-10-28 NOTE — Discharge Instructions (Addendum)
Please call the orthopedic clinic to set up a follow-up appointment as soon as possible.  I have sent medication to your pharmacy for you to take as needed to help with pain symptoms.  If you are taking the oxycodone, do not drive while on this medication or operate heavy machinery as it can cause drowsiness.  Only take it for most severe pain.  Otherwise try the other medications first.

## 2023-12-06 ENCOUNTER — Other Ambulatory Visit: Payer: Self-pay | Admitting: Nurse Practitioner

## 2023-12-06 DIAGNOSIS — I10 Essential (primary) hypertension: Secondary | ICD-10-CM

## 2024-05-17 ENCOUNTER — Other Ambulatory Visit: Payer: Self-pay | Admitting: Nurse Practitioner

## 2024-05-17 DIAGNOSIS — I10 Essential (primary) hypertension: Secondary | ICD-10-CM

## 2024-05-17 NOTE — Telephone Encounter (Signed)
 Can we get patient scheduled for his physical on 10/16/2024 or a little after please

## 2024-09-08 ENCOUNTER — Other Ambulatory Visit: Payer: Self-pay | Admitting: Nurse Practitioner

## 2024-09-08 DIAGNOSIS — I10 Essential (primary) hypertension: Secondary | ICD-10-CM

## 2024-10-05 ENCOUNTER — Other Ambulatory Visit: Payer: Self-pay | Admitting: Nurse Practitioner

## 2024-10-05 DIAGNOSIS — A6000 Herpesviral infection of urogenital system, unspecified: Secondary | ICD-10-CM

## 2024-10-05 NOTE — Telephone Encounter (Unsigned)
 Copied from CRM [id number]. Topic: Clinical - Medication Refill >> Oct 05, 2024  2:55 PM Melissa C wrote: Medication: valACYclovir  (VALTREX ) 500 MG tablet   Has the patient contacted their pharmacy? {yes/no:[id number]} (Agent: If no, request that the patient contact the pharmacy for the refill. If patient does not wish to contact the pharmacy document the reason why and proceed with request.) (Agent: If yes, when and what did the pharmacy advise?)  This is the patient's preferred pharmacy:  [facility] [address] [place] - [address] [place] [address] Phone: [phone] Fax: [phone]  Is this the correct pharmacy for this prescription? Yes If no, delete pharmacy and type the correct one.   Has the prescription been filled recently? No- patient stated he doesn't need it often, it is just a prescription he keeps on hand like a PRN  Is the patient out of the medication? Yes  Has the patient been seen for an appointment in the last year OR does the patient have an upcoming appointment? Yes last seen in office 10/18/2023, visit on the books for 02/15/2025  Can we respond through MyChart? Yes  Agent: Please be advised that Rx refills may take up to 3 business days. We ask that you follow-up with your pharmacy.

## 2024-10-05 NOTE — Telephone Encounter (Unsigned)
 Copied from CRM [id number]. Topic: Clinical - Medication Refill >> Oct 05, 2024  2:55 PM Melissa C wrote: Medication: valACYclovir  (VALTREX ) 500 MG tablet   Has the patient contacted their pharmacy? Yes (Agent: If no, request that the patient contact the pharmacy for the refill. If patient does not wish to contact the pharmacy document the reason why and proceed with request.) (Agent: If yes, when and what did the pharmacy advise?) stated doctor's office must be contacted since it has been so long since patient has seen doctor  This is the patient's preferred pharmacy:  [facility] [address] [place] - [address] [place] [address] Phone: [phone] Fax: [phone]  Is this the correct pharmacy for this prescription? Yes If no, delete pharmacy and type the correct one.   Has the prescription been filled recently? No- patient stated he doesn't need it often, it is just a prescription he keeps on hand like a PRN  Is the patient out of the medication? Yes  Has the patient been seen for an appointment in the last year OR does the patient have an upcoming appointment? Yes last seen in office 10/18/2023, visit on the books for 02/15/2025  Can we respond through MyChart? Yes  Agent: Please be advised that Rx refills may take up to 3 business days. We ask that you follow-up with your pharmacy.

## 2024-10-06 MED ORDER — VALACYCLOVIR HCL 500 MG PO TABS: ORAL_TABLET | ORAL | 1 refills | Status: DC

## 2024-10-25 ENCOUNTER — Encounter: Admitting: Nurse Practitioner

## 2024-11-05 ENCOUNTER — Other Ambulatory Visit: Payer: Self-pay | Admitting: Nurse Practitioner

## 2024-11-05 DIAGNOSIS — I10 Essential (primary) hypertension: Secondary | ICD-10-CM

## 2024-12-02 ENCOUNTER — Other Ambulatory Visit: Payer: Self-pay | Admitting: Nurse Practitioner

## 2024-12-05 ENCOUNTER — Other Ambulatory Visit: Payer: Self-pay | Admitting: Nurse Practitioner

## 2024-12-05 DIAGNOSIS — I10 Essential (primary) hypertension: Secondary | ICD-10-CM

## 2024-12-07 ENCOUNTER — Other Ambulatory Visit: Payer: Self-pay | Admitting: Nurse Practitioner

## 2024-12-07 DIAGNOSIS — A6000 Herpesviral infection of urogenital system, unspecified: Secondary | ICD-10-CM

## 2025-02-15 ENCOUNTER — Encounter: Admitting: Nurse Practitioner
